# Patient Record
Sex: Female | Born: 1955 | Race: White | Hispanic: No | Marital: Married | Smoking: Never smoker
Health system: Southern US, Community
[De-identification: ages and names within clinical notes are randomized; demographics above are authoritative.]

## PROBLEM LIST (undated history)

## (undated) DIAGNOSIS — H9190 Unspecified hearing loss, unspecified ear: Secondary | ICD-10-CM

## (undated) DIAGNOSIS — H269 Unspecified cataract: Secondary | ICD-10-CM

## (undated) HISTORY — DX: Unspecified cataract: H26.9

## (undated) HISTORY — PX: STAPEDECTOMY: SHX2435

## (undated) HISTORY — DX: Unspecified hearing loss, unspecified ear: H91.90

---

## 1997-12-20 ENCOUNTER — Ambulatory Visit (HOSPITAL_COMMUNITY): Admission: RE | Admit: 1997-12-20 | Discharge: 1997-12-20 | Payer: Self-pay | Admitting: Obstetrics and Gynecology

## 1998-12-24 ENCOUNTER — Other Ambulatory Visit: Admission: RE | Admit: 1998-12-24 | Discharge: 1998-12-24 | Payer: Self-pay | Admitting: Obstetrics and Gynecology

## 1999-03-13 ENCOUNTER — Other Ambulatory Visit: Admission: RE | Admit: 1999-03-13 | Discharge: 1999-03-13 | Payer: Self-pay | Admitting: Obstetrics and Gynecology

## 1999-12-03 ENCOUNTER — Encounter: Payer: Self-pay | Admitting: Obstetrics and Gynecology

## 1999-12-03 ENCOUNTER — Ambulatory Visit (HOSPITAL_COMMUNITY): Admission: RE | Admit: 1999-12-03 | Discharge: 1999-12-03 | Payer: Self-pay | Admitting: Obstetrics and Gynecology

## 2000-01-01 ENCOUNTER — Other Ambulatory Visit: Admission: RE | Admit: 2000-01-01 | Discharge: 2000-01-01 | Payer: Self-pay | Admitting: Obstetrics and Gynecology

## 2000-12-10 ENCOUNTER — Ambulatory Visit (HOSPITAL_COMMUNITY): Admission: RE | Admit: 2000-12-10 | Discharge: 2000-12-10 | Payer: Self-pay | Admitting: Obstetrics and Gynecology

## 2000-12-10 ENCOUNTER — Encounter: Payer: Self-pay | Admitting: Obstetrics and Gynecology

## 2001-04-01 ENCOUNTER — Other Ambulatory Visit: Admission: RE | Admit: 2001-04-01 | Discharge: 2001-04-01 | Payer: Self-pay | Admitting: Obstetrics and Gynecology

## 2001-12-29 ENCOUNTER — Ambulatory Visit (HOSPITAL_COMMUNITY): Admission: RE | Admit: 2001-12-29 | Discharge: 2001-12-29 | Payer: Self-pay | Admitting: Obstetrics and Gynecology

## 2001-12-29 ENCOUNTER — Encounter: Payer: Self-pay | Admitting: Obstetrics and Gynecology

## 2002-04-20 ENCOUNTER — Encounter: Payer: Self-pay | Admitting: Obstetrics and Gynecology

## 2002-04-20 ENCOUNTER — Encounter: Admission: RE | Admit: 2002-04-20 | Discharge: 2002-04-20 | Payer: Self-pay | Admitting: Obstetrics and Gynecology

## 2002-07-12 ENCOUNTER — Encounter: Payer: Self-pay | Admitting: Obstetrics and Gynecology

## 2002-07-12 ENCOUNTER — Encounter: Admission: RE | Admit: 2002-07-12 | Discharge: 2002-07-12 | Payer: Self-pay | Admitting: Obstetrics and Gynecology

## 2002-10-24 ENCOUNTER — Encounter: Admission: RE | Admit: 2002-10-24 | Discharge: 2002-10-24 | Payer: Self-pay | Admitting: Obstetrics and Gynecology

## 2002-10-24 ENCOUNTER — Encounter: Payer: Self-pay | Admitting: Obstetrics and Gynecology

## 2003-01-01 ENCOUNTER — Encounter: Payer: Self-pay | Admitting: Obstetrics and Gynecology

## 2003-01-01 ENCOUNTER — Ambulatory Visit (HOSPITAL_COMMUNITY): Admission: RE | Admit: 2003-01-01 | Discharge: 2003-01-01 | Payer: Self-pay | Admitting: Obstetrics and Gynecology

## 2004-01-04 ENCOUNTER — Ambulatory Visit (HOSPITAL_COMMUNITY): Admission: RE | Admit: 2004-01-04 | Discharge: 2004-01-04 | Payer: Self-pay | Admitting: Obstetrics and Gynecology

## 2004-04-17 ENCOUNTER — Other Ambulatory Visit: Admission: RE | Admit: 2004-04-17 | Discharge: 2004-04-17 | Payer: Self-pay | Admitting: Obstetrics and Gynecology

## 2005-01-07 ENCOUNTER — Ambulatory Visit (HOSPITAL_COMMUNITY): Admission: RE | Admit: 2005-01-07 | Discharge: 2005-01-07 | Payer: Self-pay | Admitting: Obstetrics and Gynecology

## 2005-05-13 ENCOUNTER — Other Ambulatory Visit: Admission: RE | Admit: 2005-05-13 | Discharge: 2005-05-13 | Payer: Self-pay | Admitting: Obstetrics and Gynecology

## 2006-01-11 ENCOUNTER — Ambulatory Visit (HOSPITAL_COMMUNITY): Admission: RE | Admit: 2006-01-11 | Discharge: 2006-01-11 | Payer: Self-pay | Admitting: Obstetrics and Gynecology

## 2006-05-18 ENCOUNTER — Other Ambulatory Visit: Admission: RE | Admit: 2006-05-18 | Discharge: 2006-05-18 | Payer: Self-pay | Admitting: Obstetrics and Gynecology

## 2007-01-19 ENCOUNTER — Ambulatory Visit (HOSPITAL_COMMUNITY): Admission: RE | Admit: 2007-01-19 | Discharge: 2007-01-19 | Payer: Self-pay | Admitting: Obstetrics and Gynecology

## 2007-05-25 ENCOUNTER — Other Ambulatory Visit: Admission: RE | Admit: 2007-05-25 | Discharge: 2007-05-25 | Payer: Self-pay | Admitting: Obstetrics and Gynecology

## 2008-01-24 ENCOUNTER — Ambulatory Visit (HOSPITAL_COMMUNITY): Admission: RE | Admit: 2008-01-24 | Discharge: 2008-01-24 | Payer: Self-pay | Admitting: Obstetrics and Gynecology

## 2008-06-12 ENCOUNTER — Other Ambulatory Visit: Admission: RE | Admit: 2008-06-12 | Discharge: 2008-06-12 | Payer: Self-pay | Admitting: Obstetrics and Gynecology

## 2009-01-24 ENCOUNTER — Ambulatory Visit (HOSPITAL_COMMUNITY): Admission: RE | Admit: 2009-01-24 | Discharge: 2009-01-24 | Payer: Self-pay | Admitting: Obstetrics and Gynecology

## 2009-01-30 ENCOUNTER — Encounter: Admission: RE | Admit: 2009-01-30 | Discharge: 2009-01-30 | Payer: Self-pay | Admitting: Obstetrics and Gynecology

## 2009-07-18 ENCOUNTER — Other Ambulatory Visit: Admission: RE | Admit: 2009-07-18 | Discharge: 2009-07-18 | Payer: Self-pay | Admitting: Obstetrics and Gynecology

## 2009-07-22 ENCOUNTER — Encounter: Admission: RE | Admit: 2009-07-22 | Discharge: 2009-07-22 | Payer: Self-pay | Admitting: Obstetrics and Gynecology

## 2010-01-30 ENCOUNTER — Encounter: Admission: RE | Admit: 2010-01-30 | Discharge: 2010-01-30 | Payer: Self-pay | Admitting: Obstetrics and Gynecology

## 2010-07-21 ENCOUNTER — Encounter: Admission: RE | Admit: 2010-07-21 | Discharge: 2010-07-21 | Payer: Self-pay | Admitting: Obstetrics and Gynecology

## 2010-10-08 ENCOUNTER — Other Ambulatory Visit
Admission: RE | Admit: 2010-10-08 | Discharge: 2010-10-08 | Payer: Self-pay | Source: Home / Self Care | Admitting: Obstetrics and Gynecology

## 2011-01-15 ENCOUNTER — Other Ambulatory Visit: Payer: Self-pay | Admitting: Obstetrics and Gynecology

## 2011-01-15 DIAGNOSIS — Z1231 Encounter for screening mammogram for malignant neoplasm of breast: Secondary | ICD-10-CM

## 2011-02-06 ENCOUNTER — Ambulatory Visit
Admission: RE | Admit: 2011-02-06 | Discharge: 2011-02-06 | Disposition: A | Discharge status: Home / Self Care | Payer: BC Managed Care – PPO | Source: Ambulatory Visit | Attending: Obstetrics and Gynecology | Admitting: Obstetrics and Gynecology

## 2011-02-06 DIAGNOSIS — Z1231 Encounter for screening mammogram for malignant neoplasm of breast: Secondary | ICD-10-CM

## 2011-10-21 ENCOUNTER — Other Ambulatory Visit: Payer: Self-pay | Admitting: Obstetrics and Gynecology

## 2011-10-21 ENCOUNTER — Other Ambulatory Visit (HOSPITAL_COMMUNITY)
Admission: RE | Admit: 2011-10-21 | Discharge: 2011-10-21 | Disposition: A | Discharge status: Home / Self Care | Payer: BC Managed Care – PPO | Source: Ambulatory Visit | Attending: Obstetrics and Gynecology | Admitting: Obstetrics and Gynecology

## 2011-10-21 DIAGNOSIS — Z01419 Encounter for gynecological examination (general) (routine) without abnormal findings: Secondary | ICD-10-CM | POA: Insufficient documentation

## 2012-01-05 ENCOUNTER — Other Ambulatory Visit (HOSPITAL_COMMUNITY): Payer: Self-pay | Admitting: Obstetrics and Gynecology

## 2012-01-05 DIAGNOSIS — Z1231 Encounter for screening mammogram for malignant neoplasm of breast: Secondary | ICD-10-CM

## 2012-02-10 ENCOUNTER — Ambulatory Visit (HOSPITAL_COMMUNITY)
Admission: RE | Admit: 2012-02-10 | Discharge: 2012-02-10 | Disposition: A | Discharge status: Home / Self Care | Payer: BC Managed Care – PPO | Source: Ambulatory Visit | Attending: Obstetrics and Gynecology | Admitting: Obstetrics and Gynecology

## 2012-02-10 DIAGNOSIS — Z1231 Encounter for screening mammogram for malignant neoplasm of breast: Secondary | ICD-10-CM | POA: Insufficient documentation

## 2013-01-26 ENCOUNTER — Other Ambulatory Visit (HOSPITAL_COMMUNITY): Payer: Self-pay | Admitting: Obstetrics and Gynecology

## 2013-01-26 DIAGNOSIS — Z1231 Encounter for screening mammogram for malignant neoplasm of breast: Secondary | ICD-10-CM

## 2013-02-13 ENCOUNTER — Ambulatory Visit (HOSPITAL_COMMUNITY)
Admission: RE | Admit: 2013-02-13 | Discharge: 2013-02-13 | Disposition: A | Discharge status: Home / Self Care | Payer: BC Managed Care – PPO | Source: Ambulatory Visit | Attending: Obstetrics and Gynecology | Admitting: Obstetrics and Gynecology

## 2013-02-13 DIAGNOSIS — Z1231 Encounter for screening mammogram for malignant neoplasm of breast: Secondary | ICD-10-CM | POA: Insufficient documentation

## 2013-10-19 HISTORY — PX: BREAST BIOPSY: SHX20

## 2014-01-29 ENCOUNTER — Other Ambulatory Visit (HOSPITAL_COMMUNITY): Payer: Self-pay | Admitting: Obstetrics and Gynecology

## 2014-01-29 DIAGNOSIS — Z1231 Encounter for screening mammogram for malignant neoplasm of breast: Secondary | ICD-10-CM

## 2014-02-16 ENCOUNTER — Ambulatory Visit (HOSPITAL_COMMUNITY)
Admission: RE | Admit: 2014-02-16 | Discharge: 2014-02-16 | Disposition: A | Discharge status: Home / Self Care | Payer: BC Managed Care – PPO | Source: Ambulatory Visit | Attending: Obstetrics and Gynecology | Admitting: Obstetrics and Gynecology

## 2014-02-16 DIAGNOSIS — Z1231 Encounter for screening mammogram for malignant neoplasm of breast: Secondary | ICD-10-CM | POA: Insufficient documentation

## 2014-02-19 ENCOUNTER — Other Ambulatory Visit: Payer: Self-pay | Admitting: Obstetrics and Gynecology

## 2014-02-19 DIAGNOSIS — R928 Other abnormal and inconclusive findings on diagnostic imaging of breast: Secondary | ICD-10-CM

## 2014-03-02 ENCOUNTER — Ambulatory Visit
Admission: RE | Admit: 2014-03-02 | Discharge: 2014-03-02 | Disposition: A | Discharge status: Home / Self Care | Payer: BC Managed Care – PPO | Source: Ambulatory Visit | Attending: Obstetrics and Gynecology | Admitting: Obstetrics and Gynecology

## 2014-03-02 ENCOUNTER — Other Ambulatory Visit: Payer: Self-pay | Admitting: Obstetrics and Gynecology

## 2014-03-02 DIAGNOSIS — R928 Other abnormal and inconclusive findings on diagnostic imaging of breast: Secondary | ICD-10-CM

## 2014-03-07 ENCOUNTER — Other Ambulatory Visit: Payer: Self-pay | Admitting: Obstetrics and Gynecology

## 2014-03-07 ENCOUNTER — Ambulatory Visit
Admission: RE | Admit: 2014-03-07 | Discharge: 2014-03-07 | Disposition: A | Discharge status: Home / Self Care | Payer: BC Managed Care – PPO | Source: Ambulatory Visit | Attending: Obstetrics and Gynecology | Admitting: Obstetrics and Gynecology

## 2014-03-07 DIAGNOSIS — R928 Other abnormal and inconclusive findings on diagnostic imaging of breast: Secondary | ICD-10-CM

## 2015-01-01 ENCOUNTER — Other Ambulatory Visit: Payer: Self-pay | Admitting: Nurse Practitioner

## 2015-01-01 ENCOUNTER — Other Ambulatory Visit (HOSPITAL_COMMUNITY)
Admission: RE | Admit: 2015-01-01 | Discharge: 2015-01-01 | Disposition: A | Discharge status: Home / Self Care | Payer: 59 | Source: Ambulatory Visit | Attending: Nurse Practitioner | Admitting: Nurse Practitioner

## 2015-01-01 DIAGNOSIS — Z1151 Encounter for screening for human papillomavirus (HPV): Secondary | ICD-10-CM | POA: Insufficient documentation

## 2015-01-01 DIAGNOSIS — Z01419 Encounter for gynecological examination (general) (routine) without abnormal findings: Secondary | ICD-10-CM | POA: Diagnosis present

## 2015-01-07 LAB — CYTOLOGY - PAP

## 2015-02-07 ENCOUNTER — Other Ambulatory Visit: Payer: Self-pay

## 2015-02-07 DIAGNOSIS — Z1231 Encounter for screening mammogram for malignant neoplasm of breast: Secondary | ICD-10-CM

## 2015-02-26 ENCOUNTER — Ambulatory Visit
Admission: RE | Admit: 2015-02-26 | Discharge: 2015-02-26 | Disposition: A | Discharge status: Home / Self Care | Payer: 59 | Source: Ambulatory Visit

## 2015-02-26 DIAGNOSIS — Z1231 Encounter for screening mammogram for malignant neoplasm of breast: Secondary | ICD-10-CM

## 2016-03-05 ENCOUNTER — Other Ambulatory Visit: Payer: Self-pay

## 2016-03-05 DIAGNOSIS — Z1231 Encounter for screening mammogram for malignant neoplasm of breast: Secondary | ICD-10-CM

## 2016-03-23 ENCOUNTER — Ambulatory Visit
Admission: RE | Admit: 2016-03-23 | Discharge: 2016-03-23 | Disposition: A | Discharge status: Home / Self Care | Payer: 59 | Source: Ambulatory Visit

## 2016-03-23 DIAGNOSIS — Z1231 Encounter for screening mammogram for malignant neoplasm of breast: Secondary | ICD-10-CM

## 2016-08-20 ENCOUNTER — Other Ambulatory Visit: Payer: Self-pay

## 2016-08-20 DIAGNOSIS — I83893 Varicose veins of bilateral lower extremities with other complications: Secondary | ICD-10-CM

## 2016-09-29 ENCOUNTER — Encounter: Payer: Self-pay | Admitting: Vascular Surgery

## 2016-10-06 ENCOUNTER — Encounter: Payer: Self-pay | Admitting: Vascular Surgery

## 2016-10-06 ENCOUNTER — Ambulatory Visit (HOSPITAL_COMMUNITY)
Admission: RE | Admit: 2016-10-06 | Discharge: 2016-10-06 | Disposition: A | Discharge status: Home / Self Care | Payer: 59 | Source: Ambulatory Visit | Attending: Vascular Surgery | Admitting: Vascular Surgery

## 2016-10-06 ENCOUNTER — Ambulatory Visit (INDEPENDENT_AMBULATORY_CARE_PROVIDER_SITE_OTHER): Payer: 59 | Admitting: Vascular Surgery

## 2016-10-06 VITALS — BP 130/84 | HR 80 | Temp 98.7°F | Resp 18 | Ht 62.5 in | Wt 129.0 lb

## 2016-10-06 DIAGNOSIS — I83893 Varicose veins of bilateral lower extremities with other complications: Secondary | ICD-10-CM | POA: Insufficient documentation

## 2016-10-06 NOTE — Progress Notes (Signed)
[facility] [place]  Patient name: Allison Kline MRN: [id number] DOB: 08/20/1956 Sex: female  REASON FOR CONSULT: Lower extremity discomfort with the varicose veins.  HPI: Allison CharityRegina Kline is a 60 y.o. female, who is seen today for discussion of lower extremity discomfort. 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 There are several different components of this. She reports an aching sensation over her anterior thighs. She reports this actually occurs after she has been standing for a long period of time rather than when she is actually standing. She notices this when she gets home and sits or lies down. This may be somewhat worse on the left leg and her right leg. She has no history of DVT. She does have a strong family history of varicosities in her father and his mother. Also in her sister. 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 She can occasionally have the pain specifically over these but the main issue is aching in her thighs. She has no history of arterial insufficiency. Health is otherwise excellent  History reviewed. No pertinent past medical history.  History reviewed. No pertinent family history.  SOCIAL HISTORY: Social History   Social History  . Marital status: Married    Spouse name: N/A  . Number of children: N/A  . Years of education: N/A   Occupational History  . Not on file.   Social History Main Topics  . Smoking status: Never Smoker  . Smokeless tobacco: Never Used  . Alcohol use No  . Drug use: No  . Sexual activity: Not on file   Other Topics Concern  . Not on file   Social History Narrative  . No narrative on file    No Known Allergies  Current Outpatient Prescriptions  Medication Sig Dispense Refill  . OSPHENA 60 MG TABS      No  current facility-administered medications for this visit.     REVIEW OF SYSTEMS:  [X]  denotes positive finding, [ ]  denotes negative finding Cardiac  Comments:  Chest pain or chest pressure:    Shortness of breath upon exertion:    Short of breath when lying flat:    Irregular heart rhythm:        Vascular    Pain in calf, thigh, or hip brought on by ambulation: x   Pain in feet at night that wakes you up from your sleep:     Blood clot in your veins:    Leg swelling:         Pulmonary    Oxygen at home:    Productive cough:     Wheezing:         Neurologic    Sudden weakness in arms or legs:     Sudden numbness in arms or legs:     Sudden onset of difficulty speaking or slurred speech:    Temporary loss of vision in one eye:     Problems with dizziness:         Gastrointestinal    Blood in stool:     Vomited blood:         Genitourinary    Burning when urinating:     Blood in urine:        Psychiatric    Major depression:         Hematologic  Bleeding problems:    Problems with blood clotting too easily:        Skin    Rashes or ulcers:        Constitutional    Fever or chills:      PHYSICAL EXAM: Vitals:   10/06/16 1550  BP: 130/84  Pulse: 80  Resp: 18  Temp: 98.7 F (37.1 C)  TempSrc: Oral  SpO2: 98%  Weight: 129 lb (58.5 kg)  Height: 5' 2.5" (1.588 m)    GENERAL: The patient is a well-nourished female, in no acute distress. The vital signs are documented above. CARDIOVASCULAR: 2+ radial and 2+ dorsalis pedis pulses bilaterally PULMONARY: There is good air exchange  ABDOMEN: Soft and non-tender  MUSCULOSKELETAL: There are no major deformities or cyanosis. NEUROLOGIC: No focal weakness or paresthesias are detected. SKIN: There are no ulcers or rashes noted. PSYCHIATRIC: The patient has a normal affect. She does have extensive telangiectasia over her anterior and lateral thighs. Also tributary varicosities and reticular veins on her distal  thighs extending into her calves. No swelling and no changes of chronic venous hypertension  DATA:  Lower extremity noninvasive studies were reviewed with the patient. This does show some mild deep venous reflux on the left. She has mild reflux in her great saphenous vein bilaterally with no dilatation of the saphenous vein  MEDICAL ISSUES: Had long discussion with this regarding her symptoms. I do not think this is related to venous pathology. Explained that with her mild reflux in her nondilated saphenous vein I would not suspect any symptoms and if so this would be her calves. Explained that she is at no increased risk for DVT or other more serious venous problems. Did explain the option of sclerotherapy for appearance concerns if these are concerning to her for both the telangiectasia and for the varicosities. Explained the treatment option and approximate out of pocket expense for these. She wishes to continue this further we'll notify should she wish to pursue this. Otherwise she was reassured with the discussion regarding the safety of this.   Larina Earthlyodd F. Ariannah Arenson, MD FACS [facility] [place] [facility] Tel [phone] Pager [phone]

## 2017-03-18 ENCOUNTER — Other Ambulatory Visit: Payer: Self-pay | Admitting: Obstetrics and Gynecology

## 2017-03-18 DIAGNOSIS — Z1231 Encounter for screening mammogram for malignant neoplasm of breast: Secondary | ICD-10-CM

## 2017-03-31 ENCOUNTER — Ambulatory Visit
Admission: RE | Admit: 2017-03-31 | Discharge: 2017-03-31 | Disposition: A | Discharge status: Home / Self Care | Payer: 59 | Source: Ambulatory Visit | Attending: Obstetrics and Gynecology | Admitting: Obstetrics and Gynecology

## 2017-03-31 DIAGNOSIS — Z1231 Encounter for screening mammogram for malignant neoplasm of breast: Secondary | ICD-10-CM

## 2017-09-17 ENCOUNTER — Encounter: Payer: Self-pay | Admitting: Family Medicine

## 2017-09-17 ENCOUNTER — Ambulatory Visit (INDEPENDENT_AMBULATORY_CARE_PROVIDER_SITE_OTHER): Payer: 59 | Admitting: Family Medicine

## 2017-09-17 VITALS — HR 68 | Ht 62.5 in | Wt 131.0 lb

## 2017-09-17 DIAGNOSIS — Z13220 Encounter for screening for lipoid disorders: Secondary | ICD-10-CM | POA: Diagnosis not present

## 2017-09-17 DIAGNOSIS — Z7689 Persons encountering health services in other specified circumstances: Secondary | ICD-10-CM | POA: Diagnosis not present

## 2017-09-17 LAB — LIPID PANEL
CHOL/HDL RATIO: 2
CHOLESTEROL: 215 mg/dL — AB (ref 0–200)
HDL: 90.5 mg/dL (ref >39.00)
LDL Cholesterol: 113 mg/dL — ABNORMAL HIGH (ref 0–99)
NonHDL: 124.11
TRIGLYCERIDES: 56 mg/dL (ref 0.0–149.0)
VLDL: 11.2 mg/dL (ref 0.0–40.0)

## 2017-09-17 NOTE — Progress Notes (Signed)
   Subjective:    Patient ID: Allison Kline, female    DOB: 11/11/1955, 61 y.o.   MRN: [id number]  HPI This is a 61 yo female who presents today to establish care. She is married with one daughter. She is a Teacher, early years/prepharmacist. She enjoys [employer] classes and walking.   Had bone density study earlier this year which showed osteopenia and had normal vitamin D. Has worked to increase weight bearing exercise. Takes calcium.   Last CPE- gyn this year Mammo- 03/31/17 Pap- 01/01/15- never abnormal Colonoscopy- due 2019, she will call about scheduling Tdap-think she had within the last 2 years Flu- annual Eye- annual Dental- regular Exercise- regular    No past medical history on file. Past Surgical History:  Procedure Laterality Date  . BREAST BIOPSY Right 2015   Family History  Problem Relation Age of Onset  . Breast cancer Maternal Grandmother    Social History   Tobacco Use  . Smoking status: Never Smoker  . Smokeless tobacco: Never Used  Substance Use Topics  . Alcohol use: No  . Drug use: No      Review of Systems  Constitutional: Negative for fatigue and unexpected weight change.  Respiratory: Negative for shortness of breath.   Cardiovascular: Positive for leg swelling (mild after working all day). Negative for chest pain.  Genitourinary: Positive for frequency (drinks a lot of water). Negative for difficulty urinating and dysuria.  Psychiatric/Behavioral: Negative for dysphoric mood. The patient is not nervous/anxious.        Objective:   Physical Exam Physical Exam  Constitutional: Oriented to person, place, and time. She appears well-developed and well-nourished.  HENT:  Head: Normocephalic and atraumatic.  Eyes: Conjunctivae are normal.  Neck: Normal range of motion. Neck supple.  Cardiovascular: Normal rate, regular rhythm and normal heart sounds.   Pulmonary/Chest: Effort normal and breath sounds normal.  Musculoskeletal: Normal range of motion.    Neurological: Alert and oriented to person, place, and time.  Skin: Skin is warm and dry.  Psychiatric: Normal mood and affect. Behavior is normal. Judgment and thought content normal.  Vitals reviewed. Pulse 68   Ht 5' 2.5" (1.588 m)   Wt 131 lb (59.4 kg)   SpO2 98%   BMI 23.58 kg/m  Wt Readings from Last 3 Encounters:  09/17/17 131 lb (59.4 kg)  10/06/16 129 lb (58.5 kg)   BP Readings from Last 3 Encounters:  10/06/16 130/84       Assessment & Plan:  1. Encounter to establish care  - Discussed and encouraged healthy lifestyle choices- adequate sleep, regular exercise, stress management and healthy food choices.  -We will request records  2. Screening for lipid disorders - Lipid panel  -Follow-up in 1 year Olean Reeeborah Gessner, FNP-BC  [facility]  09/17/2017 10:17 AM

## 2017-09-17 NOTE — Patient Instructions (Signed)
Please sign up for your mychart account  I will notify you of any findings from your records

## 2017-09-19 ENCOUNTER — Encounter: Payer: Self-pay | Admitting: Family Medicine

## 2017-12-15 ENCOUNTER — Encounter: Payer: Self-pay | Admitting: Family Medicine

## 2018-02-20 ENCOUNTER — Ambulatory Visit (HOSPITAL_COMMUNITY)
Admission: EM | Admit: 2018-02-20 | Discharge: 2018-02-20 | Disposition: A | Discharge status: Home / Self Care | Payer: 59 | Attending: Family Medicine | Admitting: Family Medicine

## 2018-02-20 ENCOUNTER — Encounter (HOSPITAL_COMMUNITY): Payer: Self-pay | Admitting: Emergency Medicine

## 2018-02-20 ENCOUNTER — Ambulatory Visit (INDEPENDENT_AMBULATORY_CARE_PROVIDER_SITE_OTHER): Payer: 59

## 2018-02-20 DIAGNOSIS — S92514A Nondisplaced fracture of proximal phalanx of right lesser toe(s), initial encounter for closed fracture: Secondary | ICD-10-CM | POA: Diagnosis not present

## 2018-02-20 NOTE — ED Triage Notes (Signed)
Pt stubbed pinkie toe of right foot last sat, has been buddy taping it but still very swollen.

## 2018-02-20 NOTE — ED Provider Notes (Signed)
[facility]    CSN: [id number] Arrival date & time: 02/20/18  1223     History   Chief Complaint Chief Complaint  Patient presents with  . Toe Pain    HPI Allison Kline is a 62 y.o. female no contributing past medical history presenting today for evaluation of left pinky toe pain and swelling.  She states that 1 week ago she was in her garage and hit her foot on a folding chair.  Since she has had swelling, taking ibuprofen, taping toe as well as apply ice.  Swelling has persisted without improvement.  Denies numbness or tingling.  Pain is mainly when she puts shoes on.  HPI  Past Medical History:  Diagnosis Date  . Cataract     There are no active problems to display for this patient.   Past Surgical History:  Procedure Laterality Date  . BREAST BIOPSY Right 2015    OB History   None      Home Medications    Prior to Admission medications   Medication Sig Start Date End Date Taking? Authorizing Provider  DUREZOL 0.05 % EMUL instill 1 drop IN EACH EYE every 2 hours FOR 24 hours, THEN instill 1 drop IN EACH EYE EVERY 4 HOURS 08/18/17   [provider]  FLORICAL 8.3-364 MG CAPS capsule TAKE 1 CAPSULE BY MOUTH 2 TIMES DAILY WITH AS VITAMIN d daily 06/29/17   [provider]  OSPHENA 60 MG TABS  09/25/16   [provider]    Family History Family History  Problem Relation Age of Onset  . Breast cancer Maternal Grandmother   . Heart disease Mother   . Hyperlipidemia Father   . Hypertension Father   . Heart disease Father   . Diabetes Brother     Social History Social History   Tobacco Use  . Smoking status: Never Smoker  . Smokeless tobacco: Never Used  Substance Use Topics  . Alcohol use: No  . Drug use: No     Allergies   Patient has no known allergies.   Review of Systems Review of Systems  Constitutional: Negative for fatigue and fever.  Respiratory: Negative for shortness of breath.   Cardiovascular:  Negative for chest pain.  Gastrointestinal: Negative for nausea and vomiting.  Musculoskeletal: Positive for gait problem, joint swelling and myalgias. Negative for arthralgias, back pain, neck pain and neck stiffness.  Skin: Negative for color change and wound.  Neurological: Negative for dizziness, weakness, light-headedness and numbness.     Physical Exam Triage Vital Signs ED Triage Vitals [02/20/18 1343]  Enc Vitals Group     BP 140/64     Pulse Rate 74     Resp 16     Temp 98.3 F (36.8 C)     Temp Source Oral     SpO2 100 %     Weight      Height      Head Circumference      Peak Flow      Pain Score      Pain Loc      Pain Edu?      Excl. in GC?    No data found.  Updated Vital Signs BP 140/64 (BP Location: Right Arm)   Pulse 74   Temp 98.3 F (36.8 C) (Oral)   Resp 16   SpO2 100%   Visual Acuity Right Eye Distance:   Left Eye Distance:   Bilateral Distance:    Right  Eye Near:   Left Eye Near:    Bilateral Near:     Physical Exam  Constitutional: She appears well-developed and well-nourished. No distress.  HENT:  Head: Normocephalic and atraumatic.  Eyes: Conjunctivae are normal.  Neck: Neck supple.  Cardiovascular: Normal rate.  Pulmonary/Chest: Effort normal. No respiratory distress.  Musculoskeletal: She exhibits no edema.  Swelling to fifth toe, tenderness to palpation at base, dorsalis pedis 2+  Neurological: She is alert.  Skin: Skin is warm and dry.  Psychiatric: She has a normal mood and affect.  Nursing note and vitals reviewed.    UC Treatments / Results  Labs (all labs ordered are listed, but only abnormal results are displayed) Labs Reviewed - No data to display  EKG None  Radiology Dg Foot Complete Right  Result Date: 02/20/2018 CLINICAL DATA:  Injury to the right fifth digit 1 week ago. Pain. Swelling. EXAM: RIGHT FOOT COMPLETE - 3+ VIEW COMPARISON:  None. FINDINGS: A transverse fracture in the base the proximal phalanx  in the fifth digit is displaced slightly medial. The DIP joint in the fifth digit is fused. No additional fractures are present. Extensive soft tissue swelling is present about the fifth digit. A metallic ring is noted on the second digit. A plantar calcaneal spur is present. Foot is otherwise unremarkable. IMPRESSION: 1. Transverse fracture involving the proximal phalanx in the fifth digit is slightly displaced. 2. Extensive soft tissue swelling about the fifth digit and fracture. 3. No additional fractures. Electronically Signed   By: Marin Roberts M.D.   On: 02/20/2018 14:27    Procedures Procedures (including critical care time)  Medications Ordered in UC Medications - No data to display  Initial Impression / Assessment and Plan / UC Course  I have reviewed the triage vital signs and the nursing notes.  Pertinent labs & imaging results that were available during my care of the patient were reviewed by me and considered in my medical decision making (see chart for details).     Fracture of proximal phalanx of fifth toe.  Slight displacement.  Will buddy tape, provide postop shoe, wear for 3 to 4 weeks.  Follow-up with orthopedics if not improving.  Continue NSAIDs, ice, elevation. Discussed strict return precautions. Patient verbalized understanding and is agreeable with plan.  Final Clinical Impressions(s) / UC Diagnoses   Final diagnoses:  Closed nondisplaced fracture of proximal phalanx of lesser toe of right foot, initial encounter     Discharge Instructions     Please continue to buddy tape, wear post-op shoe 4 weeks  Use anti-inflammatories for pain/swelling. You may take up to 800 mg Ibuprofen every 8 hours with food. You may supplement Ibuprofen with Tylenol [phone] mg every 8 hours.   Ice  Follow up with orthopedics if pain not improving   ED Prescriptions    None     Controlled Substance Prescriptions [place] Controlled Substance Registry consulted? Not  Applicable   Lew Dawes, [place] 02/20/18 1741

## 2018-02-20 NOTE — Discharge Instructions (Signed)
Please continue to buddy tape, wear post-op shoe 4 weeks  Use anti-inflammatories for pain/swelling. You may take up to 800 mg Ibuprofen every 8 hours with food. You may supplement Ibuprofen with Tylenol [phone] mg every 8 hours.   Ice  Follow up with orthopedics if pain not improving

## 2018-03-21 ENCOUNTER — Other Ambulatory Visit: Payer: Self-pay | Admitting: Obstetrics and Gynecology

## 2018-03-21 ENCOUNTER — Other Ambulatory Visit: Payer: Self-pay | Admitting: Nurse Practitioner

## 2018-03-21 DIAGNOSIS — Z1231 Encounter for screening mammogram for malignant neoplasm of breast: Secondary | ICD-10-CM

## 2018-04-08 ENCOUNTER — Ambulatory Visit
Admission: RE | Admit: 2018-04-08 | Discharge: 2018-04-08 | Disposition: A | Discharge status: Home / Self Care | Payer: 59 | Source: Ambulatory Visit | Attending: Nurse Practitioner | Admitting: Nurse Practitioner

## 2018-04-08 DIAGNOSIS — Z1231 Encounter for screening mammogram for malignant neoplasm of breast: Secondary | ICD-10-CM

## 2018-05-05 ENCOUNTER — Other Ambulatory Visit: Payer: Self-pay | Admitting: Nurse Practitioner

## 2018-05-05 ENCOUNTER — Other Ambulatory Visit (HOSPITAL_COMMUNITY)
Admission: RE | Admit: 2018-05-05 | Discharge: 2018-05-05 | Disposition: A | Discharge status: Home / Self Care | Payer: 59 | Source: Ambulatory Visit | Attending: Nurse Practitioner | Admitting: Nurse Practitioner

## 2018-05-05 DIAGNOSIS — Z01419 Encounter for gynecological examination (general) (routine) without abnormal findings: Secondary | ICD-10-CM | POA: Insufficient documentation

## 2018-05-09 LAB — CYTOLOGY - PAP
DIAGNOSIS: NEGATIVE
HPV (WINDOPATH): NOT DETECTED

## 2019-06-02 ENCOUNTER — Other Ambulatory Visit: Payer: Self-pay | Admitting: Nurse Practitioner

## 2019-06-02 DIAGNOSIS — Z1231 Encounter for screening mammogram for malignant neoplasm of breast: Secondary | ICD-10-CM

## 2019-07-06 ENCOUNTER — Other Ambulatory Visit: Payer: Self-pay

## 2019-07-06 DIAGNOSIS — Z20822 Contact with and (suspected) exposure to covid-19: Secondary | ICD-10-CM

## 2019-07-08 LAB — NOVEL CORONAVIRUS, NAA: SARS-CoV-2, NAA: NOT DETECTED

## 2019-07-17 ENCOUNTER — Ambulatory Visit: Payer: 59

## 2019-07-21 ENCOUNTER — Ambulatory Visit
Admission: RE | Admit: 2019-07-21 | Discharge: 2019-07-21 | Disposition: A | Discharge status: Home / Self Care | Payer: 59 | Source: Ambulatory Visit | Attending: Nurse Practitioner | Admitting: Nurse Practitioner

## 2019-07-21 ENCOUNTER — Other Ambulatory Visit: Payer: Self-pay

## 2019-07-21 DIAGNOSIS — Z1231 Encounter for screening mammogram for malignant neoplasm of breast: Secondary | ICD-10-CM

## 2019-11-20 ENCOUNTER — Ambulatory Visit: Payer: 59 | Attending: Internal Medicine

## 2019-11-20 DIAGNOSIS — Z20822 Contact with and (suspected) exposure to covid-19: Secondary | ICD-10-CM

## 2019-11-21 LAB — NOVEL CORONAVIRUS, NAA: SARS-CoV-2, NAA: NOT DETECTED

## 2020-06-06 ENCOUNTER — Other Ambulatory Visit: Payer: Self-pay | Admitting: Nurse Practitioner

## 2020-06-06 DIAGNOSIS — Z1231 Encounter for screening mammogram for malignant neoplasm of breast: Secondary | ICD-10-CM

## 2020-06-10 ENCOUNTER — Other Ambulatory Visit: Payer: Self-pay | Admitting: Nurse Practitioner

## 2020-06-10 DIAGNOSIS — M8580 Other specified disorders of bone density and structure, unspecified site: Secondary | ICD-10-CM

## 2020-07-22 ENCOUNTER — Other Ambulatory Visit: Payer: Self-pay

## 2020-07-22 ENCOUNTER — Ambulatory Visit
Admission: RE | Admit: 2020-07-22 | Discharge: 2020-07-22 | Disposition: A | Discharge status: Home / Self Care | Payer: 59 | Source: Ambulatory Visit | Attending: Nurse Practitioner | Admitting: Nurse Practitioner

## 2020-07-22 DIAGNOSIS — Z1231 Encounter for screening mammogram for malignant neoplasm of breast: Secondary | ICD-10-CM

## 2020-08-21 ENCOUNTER — Other Ambulatory Visit: Payer: Self-pay

## 2020-08-21 ENCOUNTER — Ambulatory Visit
Admission: RE | Admit: 2020-08-21 | Discharge: 2020-08-21 | Disposition: A | Discharge status: Home / Self Care | Payer: 59 | Source: Ambulatory Visit | Attending: Nurse Practitioner | Admitting: Nurse Practitioner

## 2020-08-21 DIAGNOSIS — M8580 Other specified disorders of bone density and structure, unspecified site: Secondary | ICD-10-CM

## 2020-12-31 NOTE — Progress Notes (Signed)
[facility] [address] [place] [address] Phone: [phone] Subjective:   I Ronelle Nigh am serving as a Neurosurgeon for Dr. Antoine Primas.  This visit occurred during the SARS-CoV-2 public health emergency.  Safety protocols were in place, including screening questions prior to the visit, additional usage of staff PPE, and extensive cleaning of exam room while observing appropriate contact time as indicated for disinfecting solutions.   I'm seeing this patient by the request  of:  Emi Belfast, FNP (Inactive)  CC: knee pain   [id number]  Allison Kline is a 65 y.o. female coming in with complaint of knee pain. 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 States she locks her left knee and puts her weight on the left leg. Works 10.5 hours a day 3 days in a row. Would like conservative treatment/ exercises.   Onset- 3/4 Location - Posterior knee  Duration-  Character-more of an aching sensation. Aggravating factors- Getting into a car, flexion  Reliving factors-  Therapies tried- heat, ice, deep blue Severity- 4/10 at its worse Does seem to get better when patient is not working.    Past Medical History:  Diagnosis Date  . Cataract    Past Surgical History:  Procedure Laterality Date  . BREAST BIOPSY Right 2015   Social History   Socioeconomic History  . Marital status: Married    Spouse name: Not on file  . Number of children: Not on file  . Years of education: Not on file  . Highest education level: Not on file  Occupational History  . Not on file  Tobacco Use  . Smoking status: Never Smoker  . Smokeless tobacco: Never Used  Substance and Sexual Activity  . Alcohol use: No  . Drug use: No  . Sexual activity: Not on file  Other Topics Concern  . Not on file  Social History Narrative  . Not on file   Social Determinants of Health   Financial Resource Strain: Not on file  Food Insecurity: Not on file   Transportation Needs: Not on file  Physical Activity: Not on file  Stress: Not on file  Social Connections: Not on file   No Known Allergies Family History  Problem Relation Age of Onset  . Breast cancer Maternal Grandmother   . Heart disease Mother   . Hyperlipidemia Father   . Hypertension Father   . Heart disease Father   . Diabetes Brother     Current Outpatient Medications (Endocrine & Metabolic):  Marland Kitchen  OSPHENA 60 MG TABS,       Current Outpatient Medications (Other):  Marland Kitchen  DUREZOL 0.05 % EMUL, instill 1 drop IN EACH EYE every 2 hours FOR 24 hours, THEN instill 1 drop IN EACH EYE EVERY 4 HOURS .  FLORICAL 8.3-364 MG CAPS capsule, TAKE 1 CAPSULE BY MOUTH 2 TIMES DAILY WITH AS VITAMIN d daily   Reviewed prior external information including notes and imaging from  primary care provider As well as notes that were available from care everywhere and other healthcare systems.  Past medical history, social, surgical and family history all reviewed in electronic medical record.  No pertanent information unless stated regarding to the chief complaint.   Review of Systems:  No headache, visual changes, nausea, vomiting, diarrhea, constipation, dizziness, abdominal pain, skin rash, fevers, chills, night sweats, weight loss, swollen lymph nodes, body aches, joint swelling, chest pain, shortness of breath, mood changes. POSITIVE muscle aches  Objective  Blood pressure 140/90, pulse 79, height 5' 2.5" (1.588 m), weight 136 lb (61.7 kg), SpO2 99 %.   General: No apparent distress alert and oriented x3 mood and affect normal, dressed appropriately.  HEENT: Pupils equal, extraocular movements intact  Respiratory: Patient's speak in full sentences and does not appear short of breath  Cardiovascular: No lower extremity edema, non tender, no erythema  Gait normal with good balance and coordination.  MSK: Patient's left knee has very mild arthritic changes noted.  Very minimal instability  with valgus and varus force.  Mild positive McMurray's noted.  Full range of motion of the knee noted.  No true masses appreciated in the popliteal area. Contralateral knee mild arthritic changes also noted.  Limited musculoskeletal ultrasound was performed and interpreted by Judi Saa  Limited ultrasound of patient's left knee shows that patient does have mild to moderate narrowing of the patellofemoral joint.  Patient does have an acute on chronic mildly displaced lateral meniscus tear noted more posterior.  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

## 2021-01-01 ENCOUNTER — Ambulatory Visit: Payer: 59 | Admitting: Family Medicine

## 2021-01-01 ENCOUNTER — Ambulatory Visit (INDEPENDENT_AMBULATORY_CARE_PROVIDER_SITE_OTHER): Payer: 59 | Admitting: Family Medicine

## 2021-01-01 ENCOUNTER — Encounter: Payer: Self-pay | Admitting: Family Medicine

## 2021-01-01 ENCOUNTER — Ambulatory Visit: Payer: Self-pay

## 2021-01-01 ENCOUNTER — Other Ambulatory Visit: Payer: Self-pay

## 2021-01-01 ENCOUNTER — Ambulatory Visit (INDEPENDENT_AMBULATORY_CARE_PROVIDER_SITE_OTHER): Payer: 59

## 2021-01-01 VITALS — BP 140/90 | HR 79 | Ht 62.5 in | Wt 136.0 lb

## 2021-01-01 DIAGNOSIS — G8929 Other chronic pain: Secondary | ICD-10-CM

## 2021-01-01 DIAGNOSIS — S83282A Other tear of lateral meniscus, current injury, left knee, initial encounter: Secondary | ICD-10-CM | POA: Diagnosis not present

## 2021-01-01 DIAGNOSIS — M2550 Pain in unspecified joint: Secondary | ICD-10-CM

## 2021-01-01 DIAGNOSIS — N952 Postmenopausal atrophic vaginitis: Secondary | ICD-10-CM | POA: Insufficient documentation

## 2021-01-01 DIAGNOSIS — M25562 Pain in left knee: Secondary | ICD-10-CM

## 2021-01-01 DIAGNOSIS — N950 Postmenopausal bleeding: Secondary | ICD-10-CM | POA: Insufficient documentation

## 2021-01-01 DIAGNOSIS — G479 Sleep disorder, unspecified: Secondary | ICD-10-CM | POA: Insufficient documentation

## 2021-01-01 DIAGNOSIS — F419 Anxiety disorder, unspecified: Secondary | ICD-10-CM | POA: Insufficient documentation

## 2021-01-01 LAB — SEDIMENTATION RATE: Sed Rate: 13 mm/hr (ref 0–30)

## 2021-01-01 NOTE — Patient Instructions (Addendum)
Good to see you Knee exercises Labs today Compression sleeve Ice 20 mins 2 times a day No pushing motions ok to work out [employer] today Pennsaid 2 times a day small amount or use voltaren gel over the counter See me again in 5-6 weeks

## 2021-01-01 NOTE — Assessment & Plan Note (Signed)
Patient has what appears to be an acute lateral meniscal tear noted.  We will get a D-dimer secondary to the acuteness of this injury.  Patient will get x-rays and does have some underlying arthritic changes noted.  Do believe though that patient has an acute lateral meniscus tear and does have a chronic medial meniscal tear.  Home exercises given, discussed compression, topical anti-inflammatories.  Follow-up with me again in 4 to 6 weeks.  If patient has any worsening pain or no improvement will consider injections or possibly formal physical therapy.  Follow-up with me again as stated in 4 to 6 weeks

## 2021-01-02 LAB — D-DIMER, QUANTITATIVE: D-Dimer, Quant: 0.27 mcg/mL FEU (ref <0.50)

## 2021-01-26 NOTE — Progress Notes (Signed)
Subjective:    Patient ID: Allison Kline, female    DOB: 06-21-1956, 65 y.o.   MRN: [id number]   This visit occurred during the SARS-CoV-2 public health emergency.  Safety protocols were in place, including screening questions prior to the visit, additional usage of staff PPE, and extensive cleaning of exam room while observing appropriate contact time as indicated for disinfecting solutions.    HPI She is here for a physical exam.   She is here to establish with a new primary care physician.  She has not been following with a PCP on a regular basis.  She feels she is getting older and should establish with one, but overall she is very healthy.   Medications and allergies reviewed with patient and updated if appropriate.  Patient Active Problem List   Diagnosis Date Noted  . Hearing loss 01/27/2021  . Osteoporosis 01/27/2021  . Anxiety 01/01/2021  . 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 Postmenopausal bleeding 01/01/2021  . Sleep disturbance 01/01/2021  . Acute lateral meniscal tear, left, initial encounter 01/01/2021    Current Outpatient Medications on File Prior to Visit  Medication Sig Dispense Refill  . FLORICAL 8.3-364 MG CAPS capsule TAKE 1 CAPSULE BY MOUTH 2 TIMES DAILY WITH AS VITAMIN d daily  3   No current facility-administered medications on file prior to visit.    Past Medical History:  Diagnosis Date  . Cataract     Past Surgical History:  Procedure Laterality Date  . BREAST BIOPSY Right 2015    Social History   Socioeconomic History  . Marital status: Married    Spouse name: Not on file  . Number of children: Not on file  . Years of education: Not on file  . Highest education level: Not on file  Occupational History  . Not on file  Tobacco Use  . Smoking status: Never Smoker  . Smokeless tobacco: Never Used  Substance and Sexual Activity  . Alcohol use: No  . Drug use: No  . Sexual activity: Not on file  Other Topics Concern  . Not on file   Social History Narrative  . Not on file   Social Determinants of Health   Financial Resource Strain: Not on file  Food Insecurity: Not on file  Transportation Needs: Not on file  Physical Activity: Not on file  Stress: Not on file  Social Connections: Not on file    Family History  Problem Relation Age of Onset  . Breast cancer Maternal Grandmother   . Cancer Maternal Grandmother   . Heart disease Mother   . Atrial fibrillation Mother   . Hyperlipidemia Father   . Hypertension Father   . Heart disease Father   . Diabetes Brother     Review of Systems  Constitutional: Negative for chills and fever.  Eyes: Negative for visual disturbance.  Respiratory: Negative for cough, shortness of breath and wheezing.   Cardiovascular: Negative for chest pain, palpitations and leg swelling.  Gastrointestinal: Negative for abdominal pain, blood in stool, constipation, diarrhea and nausea.       No gerd  Genitourinary: Negative for dysuria and hematuria.  Musculoskeletal: Positive for arthralgias (left knee - torn meniscus). Negative for back pain.  Skin: Negative for color change and rash.  Neurological: Positive for headaches (occ - work related). Negative for dizziness and light-headedness.  Psychiatric/Behavioral: Positive for sleep disturbance. Negative for dysphoric mood. The patient is nervous/anxious.        Objective:   Vitals:  01/27/21 1355  BP: 130/74  Pulse: 82  Temp: 98.6 F (37 C)  SpO2: 97%   Filed Weights   01/27/21 1355  Weight: 134 lb 9.6 oz (61.1 kg)   Body mass index is 24.23 kg/m.  BP Readings from Last 3 Encounters:  01/27/21 130/74  01/01/21 140/90  02/20/18 140/64    Wt Readings from Last 3 Encounters:  01/27/21 134 lb 9.6 oz (61.1 kg)  01/01/21 136 lb (61.7 kg)  09/17/17 131 lb (59.4 kg)    Depression screen PHQ 2/9 01/27/2021  Decreased Interest 0  Down, Depressed, Hopeless 0  PHQ - 2 Score 0  Altered sleeping 1  Tired, decreased  energy 0  Change in appetite 0  Feeling bad or failure about yourself  0  Trouble concentrating 0  Moving slowly or fidgety/restless 0  Suicidal thoughts 0  PHQ-9 Score 1  Difficult doing work/chores Not difficult at all       GAD 7 : Generalized Anxiety Score 01/27/2021  Nervous, Anxious, on Edge 0  Control/stop worrying 1  Worry too much - different things 0  Trouble relaxing 0  Restless 0  Easily annoyed or irritable 0  Afraid - awful might happen 0  Total GAD 7 Score 1  Anxiety Difficulty Not difficult at all        Physical Exam Constitutional: She appears well-developed and well-nourished. No distress.  HENT:  Head: Normocephalic and atraumatic.  Right Ear: External ear normal. Normal ear canal and TM Left Ear: External ear normal.  Normal ear canal and TM Mouth/Throat: Oropharynx is clear and moist.  Eyes: Conjunctivae and EOM are normal.  Neck: Neck supple. No tracheal deviation present. No thyromegaly present.  No carotid bruit  Cardiovascular: Normal rate, regular rhythm and normal heart sounds.   No murmur heard.  No edema. Pulmonary/Chest: Effort normal and breath sounds normal. No respiratory distress. She has no wheezes. She has no rales.  Abdominal: Soft. She exhibits no distension. There is no tenderness.  Lymphadenopathy: She has no cervical adenopathy.  Skin: Skin is warm and dry. She is not diaphoretic.  Psychiatric: She has a normal mood and affect. Her behavior is normal.        Assessment & Plan:   Physical exam: Screening blood work    ordered Immunizations  Up to date-has had all Colonoscopy  [facility]  - due-she will see who is covered by her insurance and let me know if she needs a referral Mammogram   Up to date  Gyn  Up to date  Dexa  Up to date  Eye exams   Up to date  Exercise  Regular  Weight  Normal.  Substance abuse  none   Screened for depression using the PHQ 9 scale.  No evidence of depression.   Screened for anxiety using  GAD7 Scale.  No evidence of anxiety.  She does worry some and that is related to her 83 year old daughter.  Other than that she does not feel anxious or depressed.   See Problem List for Assessment and Plan of chronic medical problems.

## 2021-01-27 ENCOUNTER — Ambulatory Visit (INDEPENDENT_AMBULATORY_CARE_PROVIDER_SITE_OTHER): Payer: 59 | Admitting: Internal Medicine

## 2021-01-27 ENCOUNTER — Encounter: Payer: Self-pay | Admitting: Internal Medicine

## 2021-01-27 ENCOUNTER — Other Ambulatory Visit: Payer: Self-pay

## 2021-01-27 VITALS — BP 130/74 | HR 82 | Temp 98.6°F | Ht 62.5 in | Wt 134.6 lb

## 2021-01-27 DIAGNOSIS — H9190 Unspecified hearing loss, unspecified ear: Secondary | ICD-10-CM

## 2021-01-27 DIAGNOSIS — Z0000 Encounter for general adult medical examination without abnormal findings: Secondary | ICD-10-CM | POA: Diagnosis not present

## 2021-01-27 DIAGNOSIS — M810 Age-related osteoporosis without current pathological fracture: Secondary | ICD-10-CM

## 2021-01-27 DIAGNOSIS — F419 Anxiety disorder, unspecified: Secondary | ICD-10-CM | POA: Diagnosis not present

## 2021-01-27 LAB — TSH: TSH: 1.16 u[IU]/mL (ref 0.35–4.50)

## 2021-01-27 LAB — LIPID PANEL
Cholesterol: 219 mg/dL — ABNORMAL HIGH (ref 0–200)
HDL: 89.2 mg/dL (ref >39.00)
LDL Cholesterol: 116 mg/dL — ABNORMAL HIGH (ref 0–99)
NonHDL: 129.71
Total CHOL/HDL Ratio: 2
Triglycerides: 70 mg/dL (ref 0.0–149.0)
VLDL: 14 mg/dL (ref 0.0–40.0)

## 2021-01-27 LAB — COMPREHENSIVE METABOLIC PANEL
ALT: 11 U/L (ref 0–35)
AST: 20 U/L (ref 0–37)
Albumin: 4.1 g/dL (ref 3.5–5.2)
Alkaline Phosphatase: 75 U/L (ref 39–117)
BUN: 14 mg/dL (ref 6–23)
CO2: 29 mEq/L (ref 19–32)
Calcium: 9.2 mg/dL (ref 8.4–10.5)
Chloride: 103 mEq/L (ref 96–112)
Creatinine, Ser: 0.75 mg/dL (ref 0.40–1.20)
GFR: 84.73 mL/min (ref >60.00)
Glucose, Bld: 87 mg/dL (ref 70–99)
Potassium: 3.8 mEq/L (ref 3.5–5.1)
Sodium: 139 mEq/L (ref 135–145)
Total Bilirubin: 0.5 mg/dL (ref 0.2–1.2)
Total Protein: 6.8 g/dL (ref 6.0–8.3)

## 2021-01-27 LAB — CBC WITH DIFFERENTIAL/PLATELET
Basophils Absolute: 0 10*3/uL (ref 0.0–0.1)
Basophils Relative: 0.7 % (ref 0.0–3.0)
Eosinophils Absolute: 0.1 10*3/uL (ref 0.0–0.7)
Eosinophils Relative: 0.8 % (ref 0.0–5.0)
HCT: 38.7 % (ref 36.0–46.0)
Hemoglobin: 13 g/dL (ref 12.0–15.0)
Lymphocytes Relative: 35.6 % (ref 12.0–46.0)
Lymphs Abs: 2.3 10*3/uL (ref 0.7–4.0)
MCHC: 33.6 g/dL (ref 30.0–36.0)
MCV: 88.6 fl (ref 78.0–100.0)
Monocytes Absolute: 0.3 10*3/uL (ref 0.1–1.0)
Monocytes Relative: 4.9 % (ref 3.0–12.0)
Neutro Abs: 3.7 10*3/uL (ref 1.4–7.7)
Neutrophils Relative %: 58 % (ref 43.0–77.0)
Platelets: 302 10*3/uL (ref 150.0–400.0)
RBC: 4.36 Mil/uL (ref 3.87–5.11)
RDW: 13.9 % (ref 11.5–15.5)
WBC: 6.4 10*3/uL (ref 4.0–10.5)

## 2021-01-27 LAB — VITAMIN D 25 HYDROXY (VIT D DEFICIENCY, FRACTURES): VITD: 55.83 ng/mL (ref 30.00–100.00)

## 2021-01-27 MED ORDER — VITAMIN D3 125 MCG (5000 UT) PO CAPS: 5000.0000 [IU] | ORAL_CAPSULE | Freq: Every day | ORAL | Status: AC

## 2021-01-27 NOTE — Assessment & Plan Note (Signed)
Chronic DEXA up-to-date She would like to avoid medication at this time.  She does plan on having a dental implant and could not take it at this time regardless She will try increasing her calcium and continue her vitamin D daily Check vitamin D level and other basic blood work Continue regular exercise

## 2021-01-27 NOTE — Patient Instructions (Signed)
Blood work was ordered.     Medications changes include :   none     Please followup in 1 year    Health Maintenance, Female Adopting a healthy lifestyle and getting preventive care are important in promoting health and wellness. Ask your health care provider about:  The right schedule for you to have regular tests and exams.  Things you can do on your own to prevent diseases and keep yourself healthy. What should I know about diet, weight, and exercise? Eat a healthy diet  Eat a diet that includes plenty of vegetables, fruits, low-fat dairy products, and lean protein.  Do not eat a lot of foods that are high in solid fats, added sugars, or sodium.   Maintain a healthy weight Body mass index (BMI) is used to identify weight problems. It estimates body fat based on height and weight. Your health care provider can help determine your BMI and help you achieve or maintain a healthy weight. Get regular exercise Get regular exercise. This is one of the most important things you can do for your health. Most adults should:  Exercise for at least 150 minutes each week. The exercise should increase your heart rate and make you sweat (moderate-intensity exercise).  Do strengthening exercises at least twice a week. This is in addition to the moderate-intensity exercise.  Spend less time sitting. Even light physical activity can be beneficial. Watch cholesterol and blood lipids Have your blood tested for lipids and cholesterol at 64 years of age, then have this test every 5 years. Have your cholesterol levels checked more often if:  Your lipid or cholesterol levels are high.  You are older than 64 years of age.  You are at high risk for heart disease. What should I know about cancer screening? Depending on your health history and family history, you may need to have cancer screening at various ages. This may include screening for:  Breast cancer.  Cervical cancer.  Colorectal  cancer.  Skin cancer.  Lung cancer. What should I know about heart disease, diabetes, and high blood pressure? Blood pressure and heart disease  High blood pressure causes heart disease and increases the risk of stroke. This is more likely to develop in people who have high blood pressure readings, are of African descent, or are overweight.  Have your blood pressure checked: ? Every 3-5 years if you are 18-39 years of age. ? Every year if you are 40 years old or older. Diabetes Have regular diabetes screenings. This checks your fasting blood sugar level. Have the screening done:  Once every three years after age 40 if you are at a normal weight and have a low risk for diabetes.  More often and at a younger age if you are overweight or have a high risk for diabetes. What should I know about preventing infection? Hepatitis B If you have a higher risk for hepatitis B, you should be screened for this virus. Talk with your health care provider to find out if you are at risk for hepatitis B infection. Hepatitis C Testing is recommended for:  Everyone born from 1945 through 1965.  Anyone with known risk factors for hepatitis C. Sexually transmitted infections (STIs)  Get screened for STIs, including gonorrhea and chlamydia, if: ? You are sexually active and are younger than 64 years of age. ? You are older than 64 years of age and your health care provider tells you that you are at risk for this type of   infection. ? Your sexual activity has changed since you were last screened, and you are at increased risk for chlamydia or gonorrhea. Ask your health care provider if you are at risk.  Ask your health care provider about whether you are at high risk for HIV. Your health care provider may recommend a prescription medicine to help prevent HIV infection. If you choose to take medicine to prevent HIV, you should first get tested for HIV. You should then be tested every 3 months for as long as  you are taking the medicine. Pregnancy  If you are about to stop having your period (premenopausal) and you may become pregnant, seek counseling before you get pregnant.  Take 400 to 800 micrograms (mcg) of folic acid every day if you become pregnant.  Ask for birth control (contraception) if you want to prevent pregnancy. Osteoporosis and menopause Osteoporosis is a disease in which the bones lose minerals and strength with aging. This can result in bone fractures. If you are 65 years old or older, or if you are at risk for osteoporosis and fractures, ask your health care provider if you should:  Be screened for bone loss.  Take a calcium or vitamin D supplement to lower your risk of fractures.  Be given hormone replacement therapy (HRT) to treat symptoms of menopause. Follow these instructions at home: Lifestyle  Do not use any products that contain nicotine or tobacco, such as cigarettes, e-cigarettes, and chewing tobacco. If you need help quitting, ask your health care provider.  Do not use street drugs.  Do not share needles.  Ask your health care provider for help if you need support or information about quitting drugs. Alcohol use  Do not drink alcohol if: ? Your health care provider tells you not to drink. ? You are pregnant, may be pregnant, or are planning to become pregnant.  If you drink alcohol: ? Limit how much you use to 0-1 drink a day. ? Limit intake if you are breastfeeding.  Be aware of how much alcohol is in your drink. In the [place] one drink equals one 12 oz bottle of beer (355 mL), one 5 oz glass of wine (148 mL), or one 1 oz glass of hard liquor (44 mL). General instructions  Schedule regular health, dental, and eye exams.  Stay current with your vaccines.  Tell your health care provider if: ? You often feel depressed. ? You have ever been abused or do not feel safe at home. Summary  Adopting a healthy lifestyle and getting preventive care are  important in promoting health and wellness.  Follow your health care provider's instructions about healthy diet, exercising, and getting tested or screened for diseases.  Follow your health care provider's instructions on monitoring your cholesterol and blood pressure. This information is not intended to replace advice given to you by your health care provider. Make sure you discuss any questions you have with your health care provider. Document Revised: 09/28/2018 Document Reviewed: 09/28/2018 Elsevier Patient Education  2021 [employer]  

## 2021-01-27 NOTE — Assessment & Plan Note (Signed)
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

## 2021-01-27 NOTE — Assessment & Plan Note (Signed)
Chronic Mild Related mostly to her daughter-she worries about her, no generalized anxiety

## 2021-02-09 NOTE — Progress Notes (Signed)
[facility] [address] [place] [address] Phone: [phone] Subjective:   Allison Kline, am serving as a scribe for Dr. Antoine Primas. This visit occurred during the SARS-CoV-2 public health emergency.  Safety protocols were in place, including screening questions prior to the visit, additional usage of staff PPE, and extensive cleaning of exam room while observing appropriate contact time as indicated for disinfecting solutions.   I'm seeing this patient by the request  of:  Pincus Sanes, MD  CC: left knee pain follow up   MOQ:[id number]  Allison Kline is a 65 y.o. female coming in with complaint of left knee pain. Feels like exercises and knee sleeve have helped her pain. Patient does PureBar and this does not bother her. Pain with standing at work has improved.   Found to have a lateral meniscus tear  ? Need for injection or PT      Past Medical History:  Diagnosis Date  . Cataract    Past Surgical History:  Procedure Laterality Date  . BREAST BIOPSY Right 2015   Social History   Socioeconomic History  . Marital status: Married    Spouse name: Not on file  . Number of children: Not on file  . Years of education: Not on file  . Highest education level: Not on file  Occupational History  . Not on file  Tobacco Use  . Smoking status: Never Smoker  . Smokeless tobacco: Never Used  Substance and Sexual Activity  . Alcohol use: No  . Drug use: No  . Sexual activity: Not on file  Other Topics Concern  . Not on file  Social History Narrative  . Not on file   Social Determinants of Health   Financial Resource Strain: Not on file  Food Insecurity: Not on file  Transportation Needs: Not on file  Physical Activity: Not on file  Stress: Not on file  Social Connections: Not on file   No Known Allergies Family History  Problem Relation Age of Onset  . Breast cancer Maternal Grandmother   . Cancer Maternal Grandmother   .  Heart disease Mother   . Atrial fibrillation Mother   . Hyperlipidemia Father   . Hypertension Father   . Heart disease Father   . Diabetes Brother          Current Outpatient Medications (Other):  Marland Kitchen  Cholecalciferol (VITAMIN D3) 125 MCG (5000 UT) CAPS, Take 1 capsule (5,000 Units total) by mouth daily. Marland Kitchen  FLORICAL 8.3-364 MG CAPS capsule, TAKE 1 CAPSULE BY MOUTH 2 TIMES DAILY WITH AS VITAMIN d daily   Reviewed prior external information including notes and imaging from  primary care provider As well as notes that were available from care everywhere and other healthcare systems.  Past medical history, social, surgical and family history all reviewed in electronic medical record.  No pertanent information unless stated regarding to the chief complaint.   Review of Systems:  No headache, visual changes, nausea, vomiting, diarrhea, constipation, dizziness, abdominal pain, skin rash, fevers, chills, night sweats, weight loss, swollen lymph nodes, body aches, joint swelling, chest pain, shortness of breath, mood changes. POSITIVE muscle aches  Objective  Blood pressure 118/74, pulse 85, height 5' 2.5" (1.588 m), weight 135 lb (61.2 kg), SpO2 97 %.   General: No apparent distress alert and oriented x3 mood and affect normal, dressed appropriately.  HEENT: Pupils equal, extraocular movements intact  Respiratory: Patient's speak in full sentences and does  not appear short of breath  Cardiovascular: No lower extremity edema, non tender, no erythema  patient's left knee seems to be doing relatively well.  Negative McMurray's.  Patient does have very mild pain on the medial and lateral joint space.  Full range of motion.  No significant abnormality gait noted.  Limited musculoskeletal ultrasound was performed and interpreted by Judi Saa  Limited ultrasound shows the patient does have still some degenerative changes of the patellofemoral joint.  Patient's medial meniscus does have a  tear noted with some mild displacement noted.  Lateral meniscus though does have significant decrease in hypoechoic changes from previous exam   Impression and Recommendations:     The above documentation has been reviewed and is accurate and complete Judi Saa, DO

## 2021-02-10 ENCOUNTER — Ambulatory Visit: Payer: Self-pay

## 2021-02-10 ENCOUNTER — Encounter: Payer: Self-pay | Admitting: Family Medicine

## 2021-02-10 ENCOUNTER — Other Ambulatory Visit: Payer: Self-pay

## 2021-02-10 ENCOUNTER — Ambulatory Visit (INDEPENDENT_AMBULATORY_CARE_PROVIDER_SITE_OTHER): Payer: 59 | Admitting: Family Medicine

## 2021-02-10 VITALS — BP 118/74 | HR 85 | Ht 62.5 in | Wt 135.0 lb

## 2021-02-10 DIAGNOSIS — M25562 Pain in left knee: Secondary | ICD-10-CM

## 2021-02-10 DIAGNOSIS — S83282A Other tear of lateral meniscus, current injury, left knee, initial encounter: Secondary | ICD-10-CM

## 2021-02-10 NOTE — Assessment & Plan Note (Signed)
Patient has made significant improvement already at this time.  Patient still has some instability noted of the knee with McMurray's but very minimal.  Patient feels like she has made significant progress and would not want to change management.  Patient will follow up with me in 2 months to make sure it completely resolves.  Can call me sooner if any worsening pain or loss encouraged.

## 2021-02-10 NOTE — Patient Instructions (Signed)
Ice after activity Continue compression Turmeric 500mg  daily  Tart cherry extract 1200mg  at night Vitamin D 2000 IU daily  See me again in 2 months if not perfect

## 2021-04-14 ENCOUNTER — Ambulatory Visit: Payer: 59 | Admitting: Family Medicine

## 2021-06-09 ENCOUNTER — Other Ambulatory Visit: Payer: Self-pay | Admitting: Nurse Practitioner

## 2021-06-09 DIAGNOSIS — Z1231 Encounter for screening mammogram for malignant neoplasm of breast: Secondary | ICD-10-CM

## 2021-07-28 ENCOUNTER — Ambulatory Visit: Payer: 59

## 2021-08-01 ENCOUNTER — Ambulatory Visit
Admission: RE | Admit: 2021-08-01 | Discharge: 2021-08-01 | Disposition: A | Discharge status: Home / Self Care | Payer: 59 | Source: Ambulatory Visit | Attending: Nurse Practitioner | Admitting: Nurse Practitioner

## 2021-08-01 ENCOUNTER — Other Ambulatory Visit: Payer: Self-pay

## 2021-08-01 DIAGNOSIS — Z1231 Encounter for screening mammogram for malignant neoplasm of breast: Secondary | ICD-10-CM

## 2021-12-18 DIAGNOSIS — H8023 Cochlear otosclerosis, bilateral: Secondary | ICD-10-CM | POA: Insufficient documentation

## 2021-12-18 DIAGNOSIS — H903 Sensorineural hearing loss, bilateral: Secondary | ICD-10-CM | POA: Insufficient documentation

## 2022-02-01 ENCOUNTER — Encounter: Payer: Self-pay | Admitting: Internal Medicine

## 2022-02-01 NOTE — Patient Instructions (Addendum)
? ?An EKG was done ? ? ?Blood work was ordered.   ? ? ?Medications changes include :  none  ? ? ? ?Return in about 1 year (around 02/03/2023) for Physical Exam. ? ? ?Health Maintenance, Female ?Adopting a healthy lifestyle and getting preventive care are important in promoting health and wellness. Ask your health care provider about: ?The right schedule for you to have regular tests and exams. ?Things you can do on your own to prevent diseases and keep yourself healthy. ?What should I know about diet, weight, and exercise? ?Eat a healthy diet ? ?Eat a diet that includes plenty of vegetables, fruits, low-fat dairy products, and lean protein. ?Do not eat a lot of foods that are high in solid fats, added sugars, or sodium. ?Maintain a healthy weight ?Body mass index (BMI) is used to identify weight problems. It estimates body fat based on height and weight. Your health care provider can help determine your BMI and help you achieve or maintain a healthy weight. ?Get regular exercise ?Get regular exercise. This is one of the most important things you can do for your health. Most adults should: ?Exercise for at least 150 minutes each week. The exercise should increase your heart rate and make you sweat (moderate-intensity exercise). ?Do strengthening exercises at least twice a week. This is in addition to the moderate-intensity exercise. ?Spend less time sitting. Even light physical activity can be beneficial. ?Watch cholesterol and blood lipids ?Have your blood tested for lipids and cholesterol at 65 years of age, then have this test every 5 years. ?Have your cholesterol levels checked more often if: ?Your lipid or cholesterol levels are high. ?You are older than 65 years of age. ?You are at high risk for heart disease. ?What should I know about cancer screening? ?Depending on your health history and family history, you may need to have cancer screening at various ages. This may include screening for: ?Breast  cancer. ?Cervical cancer. ?Colorectal cancer. ?Skin cancer. ?Lung cancer. ?What should I know about heart disease, diabetes, and high blood pressure? ?Blood pressure and heart disease ?High blood pressure causes heart disease and increases the risk of stroke. This is more likely to develop in people who have high blood pressure readings or are overweight. ?Have your blood pressure checked: ?Every 3-5 years if you are 32-54 years of age. ?Every year if you are 36 years old or older. ?Diabetes ?Have regular diabetes screenings. This checks your fasting blood sugar level. Have the screening done: ?Once every three years after age 50 if you are at a normal weight and have a low risk for diabetes. ?More often and at a younger age if you are overweight or have a high risk for diabetes. ?What should I know about preventing infection? ?Hepatitis B ?If you have a higher risk for hepatitis B, you should be screened for this virus. Talk with your health care provider to find out if you are at risk for hepatitis B infection. ?Hepatitis C ?Testing is recommended for: ?Everyone born from [birth year] through 1964. ?Anyone with known risk factors for hepatitis C. ?Sexually transmitted infections (STIs) ?Get screened for STIs, including gonorrhea and chlamydia, if: ?You are sexually active and are younger than 65 years of age. ?You are older than 65 years of age and your health care provider tells you that you are at risk for this type of infection. ?Your sexual activity has changed since you were last screened, and you are at increased risk for chlamydia or gonorrhea.  Ask your health care provider if you are at risk. ?Ask your health care provider about whether you are at high risk for HIV. Your health care provider may recommend a prescription medicine to help prevent HIV infection. If you choose to take medicine to prevent HIV, you should first get tested for HIV. You should then be tested every 3 months for as long as you are taking  the medicine. ?Pregnancy ?If you are about to stop having your period (premenopausal) and you may become pregnant, seek counseling before you get pregnant. ?Take 400 to 800 micrograms (mcg) of folic acid every day if you become pregnant. ?Ask for birth control (contraception) if you want to prevent pregnancy. ?Osteoporosis and menopause ?Osteoporosis is a disease in which the bones lose minerals and strength with aging. This can result in bone fractures. If you are 75 years old or older, or if you are at risk for osteoporosis and fractures, ask your health care provider if you should: ?Be screened for bone loss. ?Take a calcium or vitamin D supplement to lower your risk of fractures. ?Be given hormone replacement therapy (HRT) to treat symptoms of menopause. ?Follow these instructions at home: ?Alcohol use ?Do not drink alcohol if: ?Your health care provider tells you not to drink. ?You are pregnant, may be pregnant, or are planning to become pregnant. ?If you drink alcohol: ?Limit how much you have to: ?0-1 drink a day. ?Know how much alcohol is in your drink. In the [place] one drink equals one 12 oz bottle of beer (355 mL), one 5 oz glass of wine (148 mL), or one 1? oz glass of hard liquor (44 mL). ?Lifestyle ?Do not use any products that contain nicotine or tobacco. These products include cigarettes, chewing tobacco, and vaping devices, such as e-cigarettes. If you need help quitting, ask your health care provider. ?Do not use street drugs. ?Do not share needles. ?Ask your health care provider for help if you need support or information about quitting drugs. ?General instructions ?Schedule regular health, dental, and eye exams. ?Stay current with your vaccines. ?Tell your health care provider if: ?You often feel depressed. ?You have ever been abused or do not feel safe at home. ?Summary ?Adopting a healthy lifestyle and getting preventive care are important in promoting health and wellness. ?Follow your health  care provider's instructions about healthy diet, exercising, and getting tested or screened for diseases. ?Follow your health care provider's instructions on monitoring your cholesterol and blood pressure. ?This information is not intended to replace advice given to you by your health care provider. Make sure you discuss any questions you have with your health care provider. ?Document Revised: 02/24/2021 Document Reviewed: 02/24/2021 ?Elsevier Patient Education ? [place]. ? ?

## 2022-02-01 NOTE — Progress Notes (Signed)
? ? ?Subjective:  ? ? Patient ID: Allison Kline, female    DOB: [birth year], 66 y.o.   MRN: [id number] ? ? ?This visit occurred during the SARS-CoV-2 public health emergency.  Safety protocols were in place, including screening questions prior to the visit, additional usage of staff PPE, and extensive cleaning of exam room while observing appropriate contact time as indicated for disinfecting solutions. ? ? ? ?HPI ?Allison Kline is here for  ?Chief Complaint  ?Patient presents with  ? Annual Exam  ? Physical  ?  Muscle pain in right thigh  ? New meds  ?  Patient says she is taking Tumeric 500mg  tqd and Biotin 10,071mcg  ?tqd  ? ? ?Soreness in right upper lateral thigh.  She walks.  It is ok with walking, pur bare.  It bothers her after sitting for a while.  It started in Nov - got better.  The past couple of weeks it has started to bother her.  ? ? ?She does pur bar or walks.  Her right leg is a little weaker when she does planks.  No pain in lower back or buttock.  Has been doing more the past couple of weeks.  No n/T in the leg.   ? ?Two weeks ago she she did do more sitting when she visited her parents.  She wonders if that flared it up. ? ?She does note that stretching helps. ? ? ?Medications and allergies reviewed with patient and updated if appropriate. ? ? ?Current Outpatient Medications on File Prior to Visit  ?Medication Sig Dispense Refill  ? Cholecalciferol (VITAMIN D3) 125 MCG (5000 UT) CAPS Take 1 capsule (5,000 Units total) by mouth daily. 30 capsule   ? FLORICAL 8.3-364 MG CAPS capsule TAKE 1 CAPSULE BY MOUTH 2 TIMES DAILY WITH AS VITAMIN d daily  3  ? ?No current facility-administered medications on file prior to visit.  ? ? ?Review of Systems  ?Constitutional:  Negative for fever.  ?Eyes:  Negative for visual disturbance.  ?Respiratory:  Negative for cough, shortness of breath and wheezing.   ?Cardiovascular:  Negative for chest pain, palpitations and leg swelling.  ?Gastrointestinal:  Negative for abdominal  pain, blood in stool, constipation, diarrhea and nausea.  ?     No gerd  ?Genitourinary:  Negative for dysuria.  ?Musculoskeletal:  Positive for arthralgias (mild). Negative for back pain.  ?Skin:  Negative for rash.  ?Neurological:  Positive for headaches. Negative for light-headedness.  ?Psychiatric/Behavioral:  Positive for sleep disturbance. Negative for dysphoric mood. The patient is nervous/anxious.   ? ?   ?Objective:  ? ?Vitals:  ? 02/02/22 1259  ?BP: 128/76  ?Pulse: 72  ?Temp: 98.3 ?F (36.8 ?C)  ?SpO2: 96%  ? ?Filed Weights  ? 02/02/22 1259  ?Weight: 137 lb (62.1 kg)  ? ?Body mass index is 24.66 kg/m?. ? ?BP Readings from Last 3 Encounters:  ?02/02/22 128/76  ?02/10/21 118/74  ?01/27/21 130/74  ? ? ?Wt Readings from Last 3 Encounters:  ?02/02/22 137 lb (62.1 kg)  ?02/10/21 135 lb (61.2 kg)  ?01/27/21 134 lb 9.6 oz (61.1 kg)  ? ? ? ?  02/02/2022  ?  1:04 PM 01/27/2021  ?  4:44 PM  ?Depression screen PHQ 2/9  ?Decreased Interest 0 0  ?Down, Depressed, Hopeless 0 0  ?PHQ - 2 Score 0 0  ?Altered sleeping  1  ?Tired, decreased energy  0  ?Change in appetite  0  ?Feeling bad or failure about yourself  0  ?Trouble concentrating  0  ?Moving slowly or fidgety/restless  0  ?Suicidal thoughts  0  ?PHQ-9 Score  1  ?Difficult doing work/chores  Not difficult at all  ? ? ? ? ?  01/27/2021  ?  4:44 PM  ?GAD 7 : Generalized Anxiety Score  ?Nervous, Anxious, on Edge 0  ?Control/stop worrying 1  ?Worry too much - different things 0  ?Trouble relaxing 0  ?Restless 0  ?Easily annoyed or irritable 0  ?Afraid - awful might happen 0  ?Total GAD 7 Score 1  ?Anxiety Difficulty Not difficult at all  ? ? ? ? ?  ?Physical Exam ?Constitutional: She appears well-developed and well-nourished. No distress.  ?HENT:  ?Head: Normocephalic and atraumatic.  ?Right Ear: External ear normal. Normal ear canal and TM ?Left Ear: External ear normal.  Normal ear canal and TM ?Mouth/Throat: Oropharynx is clear and moist.  ?Eyes: Conjunctivae and EOM  are normal.  ?Neck: Neck supple. No tracheal deviation present. No thyromegaly present.  ?No carotid bruit  ?Cardiovascular: Normal rate, regular rhythm and normal heart sounds.   ?No murmur heard.  No edema. ?Pulmonary/Chest: Effort normal and breath sounds normal. No respiratory distress. She has no wheezes. She has no rales.  ?Breast: deferred   ?Abdominal: Soft. She exhibits no distension. There is no tenderness.  ?Lymphadenopathy: She has no cervical adenopathy.  ?Skin: Skin is warm and dry. She is not diaphoretic.  ?Psychiatric: She has a normal mood and affect. Her behavior is normal.  ? ? ? ?Lab Results  ?Component Value Date  ? WBC 6.4 01/27/2021  ? HGB 13.0 01/27/2021  ? HCT 38.7 01/27/2021  ? PLT 302.0 01/27/2021  ? GLUCOSE 87 01/27/2021  ? CHOL 219 (H) 01/27/2021  ? TRIG 70.0 01/27/2021  ? HDL 89.20 01/27/2021  ? LDLCALC 116 (H) 01/27/2021  ? ALT 11 01/27/2021  ? AST 20 01/27/2021  ? NA 139 01/27/2021  ? K 3.8 01/27/2021  ? CL 103 01/27/2021  ? CREATININE 0.75 01/27/2021  ? BUN 14 01/27/2021  ? CO2 29 01/27/2021  ? TSH 1.16 01/27/2021  ? ? ? ? ?   ?Assessment & Plan:  ? ?Physical exam: ?Screening blood work  ordered ?Exercise regular-pure bar, walking ?Weight normal ?Substance abuse  none ? ? ?Reviewed recommended immunizations. ? ? ?Health Maintenance  ?Topic Date Due  ? COLONOSCOPY (Pts 45-40yrs Insurance coverage will need to be confirmed)  Never done  ? COVID-19 Vaccine (1) 02/18/2022 (Originally 02/21/1957)  ? Hepatitis C Screening  01/27/2054 (Originally 08/25/1975)  ? HIV Screening  01/27/2054 (Originally 08/24/1972)  ? INFLUENZA VACCINE  05/19/2022  ? MAMMOGRAM  08/01/2022  ? DEXA SCAN  08/21/2022  ? PAP SMEAR-Modifier  05/06/2023  ? TETANUS/TDAP  10/20/2023  ? HPV VACCINES  Aged Out  ? Zoster Vaccines- Shingrix  Discontinued  ?  ? ? ? ? ? ? ?See Problem List for Assessment and Plan of chronic medical problems. ? ? ? ? ?

## 2022-02-02 ENCOUNTER — Ambulatory Visit (INDEPENDENT_AMBULATORY_CARE_PROVIDER_SITE_OTHER): Payer: 59 | Admitting: Internal Medicine

## 2022-02-02 VITALS — BP 128/76 | HR 72 | Temp 98.3°F | Ht 62.5 in | Wt 137.0 lb

## 2022-02-02 DIAGNOSIS — M810 Age-related osteoporosis without current pathological fracture: Secondary | ICD-10-CM

## 2022-02-02 DIAGNOSIS — G479 Sleep disorder, unspecified: Secondary | ICD-10-CM

## 2022-02-02 DIAGNOSIS — Z0000 Encounter for general adult medical examination without abnormal findings: Secondary | ICD-10-CM | POA: Diagnosis not present

## 2022-02-02 DIAGNOSIS — R002 Palpitations: Secondary | ICD-10-CM | POA: Diagnosis not present

## 2022-02-02 DIAGNOSIS — F419 Anxiety disorder, unspecified: Secondary | ICD-10-CM

## 2022-02-02 DIAGNOSIS — Z13220 Encounter for screening for lipoid disorders: Secondary | ICD-10-CM

## 2022-02-02 DIAGNOSIS — M791 Myalgia, unspecified site: Secondary | ICD-10-CM | POA: Insufficient documentation

## 2022-02-02 LAB — CBC WITH DIFFERENTIAL/PLATELET
Basophils Absolute: 0.1 10*3/uL (ref 0.0–0.1)
Basophils Relative: 1 % (ref 0.0–3.0)
Eosinophils Absolute: 0.1 10*3/uL (ref 0.0–0.7)
Eosinophils Relative: 1.3 % (ref 0.0–5.0)
HCT: 38.1 % (ref 36.0–46.0)
Hemoglobin: 12.8 g/dL (ref 12.0–15.0)
Lymphocytes Relative: 30.2 % (ref 12.0–46.0)
Lymphs Abs: 1.7 10*3/uL (ref 0.7–4.0)
MCHC: 33.5 g/dL (ref 30.0–36.0)
MCV: 88.3 fl (ref 78.0–100.0)
Monocytes Absolute: 0.3 10*3/uL (ref 0.1–1.0)
Monocytes Relative: 5.8 % (ref 3.0–12.0)
Neutro Abs: 3.5 10*3/uL (ref 1.4–7.7)
Neutrophils Relative %: 61.7 % (ref 43.0–77.0)
Platelets: 281 10*3/uL (ref 150.0–400.0)
RBC: 4.31 Mil/uL (ref 3.87–5.11)
RDW: 13.3 % (ref 11.5–15.5)
WBC: 5.6 10*3/uL (ref 4.0–10.5)

## 2022-02-02 LAB — COMPREHENSIVE METABOLIC PANEL
ALT: 13 U/L (ref 0–35)
AST: 21 U/L (ref 0–37)
Albumin: 4.3 g/dL (ref 3.5–5.2)
Alkaline Phosphatase: 77 U/L (ref 39–117)
BUN: 20 mg/dL (ref 6–23)
CO2: 29 mEq/L (ref 19–32)
Calcium: 9.4 mg/dL (ref 8.4–10.5)
Chloride: 102 mEq/L (ref 96–112)
Creatinine, Ser: 0.74 mg/dL (ref 0.40–1.20)
GFR: 85.49 mL/min (ref >60.00)
Glucose, Bld: 88 mg/dL (ref 70–99)
Potassium: 4.1 mEq/L (ref 3.5–5.1)
Sodium: 139 mEq/L (ref 135–145)
Total Bilirubin: 0.6 mg/dL (ref 0.2–1.2)
Total Protein: 7.1 g/dL (ref 6.0–8.3)

## 2022-02-02 LAB — LIPID PANEL
Cholesterol: 235 mg/dL — ABNORMAL HIGH (ref 0–200)
HDL: 94 mg/dL (ref >39.00)
LDL Cholesterol: 131 mg/dL — ABNORMAL HIGH (ref 0–99)
NonHDL: 141.39
Total CHOL/HDL Ratio: 3
Triglycerides: 51 mg/dL (ref 0.0–149.0)
VLDL: 10.2 mg/dL (ref 0.0–40.0)

## 2022-02-02 LAB — VITAMIN D 25 HYDROXY (VIT D DEFICIENCY, FRACTURES): VITD: 63.98 ng/mL (ref 30.00–100.00)

## 2022-02-02 LAB — TSH: TSH: 2.22 u[IU]/mL (ref 0.35–5.50)

## 2022-02-02 NOTE — Assessment & Plan Note (Signed)
Acute ?Has soreness in her upper thigh regions that improves with stretching ?It does not bother her with exercise, but tends to bother her after sitting or laying for a period of time ?Likely muscular tightness ?She will make an effort to stretch more and see if that helps ?If this continues and consider referral to sports medicine for further evaluation ?

## 2022-02-02 NOTE — Assessment & Plan Note (Signed)
Chronic ?Mild ?Not on any medication and does not feel like she needs any medication ?

## 2022-02-02 NOTE — Assessment & Plan Note (Signed)
Chronic ?DEXA up-to-date ?She is exercising regularly ?She is taking vitamin D daily and calcium ?Check vitamin D level ?Would like to avoid medications at this time-recheck DEXA in a couple of years ?

## 2022-02-02 NOTE — Assessment & Plan Note (Signed)
Rash ?No EKG on file-we will get EKG today ?EKG shows NSR at 73 bpm, normal EKG.  No prior EKG for comparison ?Labs today including TSH, CBC, CMP ?

## 2022-06-15 ENCOUNTER — Other Ambulatory Visit: Payer: Self-pay | Admitting: Nurse Practitioner

## 2022-06-15 DIAGNOSIS — M810 Age-related osteoporosis without current pathological fracture: Secondary | ICD-10-CM

## 2022-06-19 ENCOUNTER — Other Ambulatory Visit: Payer: Self-pay | Admitting: Nurse Practitioner

## 2022-06-19 DIAGNOSIS — Z1231 Encounter for screening mammogram for malignant neoplasm of breast: Secondary | ICD-10-CM

## 2022-08-10 ENCOUNTER — Ambulatory Visit
Admission: RE | Admit: 2022-08-10 | Discharge: 2022-08-10 | Disposition: A | Discharge status: Home / Self Care | Payer: 59 | Source: Ambulatory Visit | Attending: Nurse Practitioner | Admitting: Nurse Practitioner

## 2022-08-10 DIAGNOSIS — Z1231 Encounter for screening mammogram for malignant neoplasm of breast: Secondary | ICD-10-CM
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
## 2022-08-28 ENCOUNTER — Ambulatory Visit (INDEPENDENT_AMBULATORY_CARE_PROVIDER_SITE_OTHER): Payer: 59 | Admitting: Sports Medicine

## 2022-08-28 ENCOUNTER — Ambulatory Visit (INDEPENDENT_AMBULATORY_CARE_PROVIDER_SITE_OTHER): Payer: 59

## 2022-08-28 VITALS — BP 118/80 | HR 81 | Ht 62.0 in | Wt 138.0 lb

## 2022-08-28 DIAGNOSIS — M87051 Idiopathic aseptic necrosis of right femur: Secondary | ICD-10-CM | POA: Diagnosis not present

## 2022-08-28 DIAGNOSIS — M25551 Pain in right hip: Secondary | ICD-10-CM

## 2022-08-28 DIAGNOSIS — M1611 Unilateral primary osteoarthritis, right hip: Secondary | ICD-10-CM | POA: Diagnosis not present

## 2022-08-28 DIAGNOSIS — M810 Age-related osteoporosis without current pathological fracture: Secondary | ICD-10-CM

## 2022-08-28 NOTE — Patient Instructions (Addendum)
Good to see you  Hip HEP  MRI referral  Follow up 3 days after to discuss your MRI

## 2022-09-07 ENCOUNTER — Ambulatory Visit (INDEPENDENT_AMBULATORY_CARE_PROVIDER_SITE_OTHER): Payer: 59

## 2022-09-07 DIAGNOSIS — M25551 Pain in right hip: Secondary | ICD-10-CM

## 2022-09-07 DIAGNOSIS — M87051 Idiopathic aseptic necrosis of right femur: Secondary | ICD-10-CM

## 2022-09-07 DIAGNOSIS — M810 Age-related osteoporosis without current pathological fracture: Secondary | ICD-10-CM

## 2022-09-07 DIAGNOSIS — M1611 Unilateral primary osteoarthritis, right hip: Secondary | ICD-10-CM
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
## 2022-09-09 ENCOUNTER — Ambulatory Visit (INDEPENDENT_AMBULATORY_CARE_PROVIDER_SITE_OTHER): Payer: 59 | Admitting: Sports Medicine

## 2022-09-09 VITALS — BP 110/78 | HR 76 | Ht 62.0 in | Wt 138.0 lb

## 2022-09-09 DIAGNOSIS — M1611 Unilateral primary osteoarthritis, right hip: Secondary | ICD-10-CM

## 2022-09-09 DIAGNOSIS — M25551 Pain in right hip: Secondary | ICD-10-CM

## 2022-09-09 DIAGNOSIS — S73191A Other sprain of right hip, initial encounter: Secondary | ICD-10-CM | POA: Diagnosis not present

## 2022-09-09 MED ORDER — MELOXICAM 15 MG PO TABS: 15.0000 mg | ORAL_TABLET | Freq: Every day | ORAL | 0 refills | Status: DC

## 2022-09-09 NOTE — Patient Instructions (Addendum)
Good to see you - Start meloxicam 15 mg daily x2 weeks.  If still having pain after 2 weeks, complete 3rd-week of meloxicam. May use remaining meloxicam as needed once daily for pain control.  Do not to use additional NSAIDs while taking meloxicam.  May use Tylenol 500-1000 mg 2 to 3 times a day for breakthrough pain. Continue HEP 3-4 week follow up  
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
## 2022-10-21 ENCOUNTER — Ambulatory Visit (INDEPENDENT_AMBULATORY_CARE_PROVIDER_SITE_OTHER): Payer: 59 | Admitting: Sports Medicine

## 2022-10-21 VITALS — BP 124/82 | HR 83 | Ht 62.0 in | Wt 137.0 lb

## 2022-10-21 DIAGNOSIS — M1611 Unilateral primary osteoarthritis, right hip: Secondary | ICD-10-CM

## 2022-10-21 DIAGNOSIS — M25551 Pain in right hip: Secondary | ICD-10-CM | POA: Diagnosis not present

## 2022-10-21 DIAGNOSIS — S73191D Other sprain of right hip, subsequent encounter: Secondary | ICD-10-CM | POA: Diagnosis not present

## 2022-10-21 NOTE — Patient Instructions (Signed)
Pt referral  Ortho referral  6 week follow up

## 2022-11-03 ENCOUNTER — Encounter: Payer: Self-pay | Admitting: Sports Medicine

## 2022-11-03 NOTE — Telephone Encounter (Signed)
Messaged via mychart and provided phone numbers

## 2022-11-12 NOTE — Therapy (Signed)
OUTPATIENT PHYSICAL THERAPY EVALUATION   Patient Name: Allison Kline MRN: [id number] DOB:Feb 07, 1956, 67 y.o., female Today's Date: 11/13/2022   END OF SESSION:  PT End of Session - 11/13/22 1025     Visit Number 1    Number of Visits 13    Date for PT Re-Evaluation 12/25/22    Authorization Type [employer]    PT Start Time 0037    PT Stop Time 1100    PT Time Calculation (min) 45 min    Activity Tolerance Patient tolerated treatment well    Behavior During Therapy [facility] for tasks assessed/performed             Past Medical History:  Diagnosis Date   Cataract    Hearing loss    Past Surgical History:  Procedure Laterality Date   BREAST BIOPSY Right 2015   STAPEDECTOMY Bilateral    Patient Active Problem List   Diagnosis Date Noted   Palpitations 02/02/2022   Muscle soreness 02/02/2022   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   Hearing loss 01/27/2021   Osteoporosis 01/27/2021   Anxiety 01/01/2021   Atrophic vaginitis 01/01/2021   Sleep disturbance 01/01/2021   Acute lateral meniscal tear, left, initial encounter 01/01/2021    PCP: Binnie Rail, MD  REFERRING PROVIDER: Glennon Mac, DO  REFERRING DIAG: Right hip pain; Primary osteoarthritis of right hip; Tear of right acetabular labrum, subsequent encounter   THERAPY DIAG:  Pain in right hip  Muscle weakness (generalized)  Other abnormalities of gait and mobility  Rationale for Evaluation and Treatment: Rehabilitation  ONSET DATE: ongoing > 1 year   SUBJECTIVE:  SUBJECTIVE STATEMENT: Patient reports right hip pain for over a year now. She initially thought it was hip flexor or ITB but had an x-ray and found out it was arthritis. MRI confirmed the bone on bone. She stands all day at work and these days she will have more pain. She also has more achiness as night and will colder or rainy weather. She has times where her her hip will feel like it  will give out on her. She feels like she favors the right and this causes knee pain and she feels uneven. She also reports she is not able to sleep on the right hip.   PERTINENT HISTORY: Osteoporosis  PAIN:  Are you having pain? Yes:  NPRS scale: 2/10 Pain location: Right hip Pain description: Aching,  Aggravating factors: Standing, walking, bending, lying on right side Relieving factors: Rest  PRECAUTIONS: None  WEIGHT BEARING RESTRICTIONS: No  FALLS:  Has patient fallen in last 6 months? No  OCCUPATION: Pharmacist so stands all day  PLOF: Independent  PATIENT GOALS: Improve hip pain and strength   OBJECTIVE:  DIAGNOSTIC FINDINGS:   Hip X-ray 08/29/2023 There is no evidence of hip fracture or dislocation. Severe, asymmetric superior joint space narrowing and femoroacetabular osteophytosis of the right hip. Left hip is relatively preserved, seen in frontal view only.  Hip MRI 09/08/2023 Advanced right hip osteoarthritis with associated degenerative tearing of the right labrum. Much milder appearing left hip osteoarthritis also noted.  PATIENT SURVEYS:  FOTO 60% functional status  COGNITION: Overall cognitive status: Within functional limits for tasks assessed     SENSATION: WFL  MUSCLE LENGTH: Grossly WFL  POSTURE:   WFL  PALPATION: Mildly tender to palpation right gluteal region and greater trochanter  LOWER EXTREMITY ROM: Patient demonstrates limitations in all ranges of right hip, primarily hip  flexion to approximately 90 deg and IR to neutral on right with PROM  LOWER EXTREMITY MMT:  MMT Right eval Left eval  Hip flexion 4 5  Hip extension 4- 4  Hip abduction 4- 4  Hip adduction    Hip internal rotation    Hip external rotation    Knee flexion 4+ 5  Knee extension 4+ 5  Ankle dorsiflexion    Ankle plantarflexion    Ankle inversion    Ankle eversion     (Blank rows = not tested)  FUNCTIONAL TESTS:  SLS: trendelenburg on  right  GAIT: Assistive device utilized: None Level of assistance: Complete Independence Comments: Trendelenburg on right   TODAY'S TREATMENT:        [facility] Adult PT Treatment:                                                DATE: 11/13/2022 Therapeutic Exercise: Figure-4 bridge x 10 Clamshell with red x 10  PATIENT EDUCATION:  Education details: Exam findings, POC, HEP Person educated: Patient Education method: Explanation, Demonstration, Tactile cues, Verbal cues, and Handouts Education comprehension: verbalized understanding, returned demonstration, verbal cues required, tactile cues required, and needs further education  HOME EXERCISE PROGRAM: Access Code: [facility]    ASSESSMENT: CLINICAL IMPRESSION: Patient is a 67 y.o. female who was seen today for physical therapy evaluation and treatment for chronic right hip pain. She demonstrates limitations primarily in hip flexion and IR that are consistent with arthritis, generalized right hip strength deficits and trendelenburg gait consistent with hip weakness likely leading to pain and a decreased activity tolerance with activities such as standing and walking at work.   OBJECTIVE IMPAIRMENTS: Abnormal gait, decreased activity tolerance, decreased ROM, decreased strength, and pain.   ACTIVITY LIMITATIONS: standing, squatting, sleeping, and locomotion level  PARTICIPATION LIMITATIONS: community activity and occupation  PERSONAL FACTORS: Time since onset of injury/illness/exacerbation are also affecting patient's functional outcome.   REHAB POTENTIAL: Good  CLINICAL DECISION MAKING: Stable/uncomplicated  EVALUATION COMPLEXITY: Low   GOALS: Goals reviewed with patient? Yes  SHORT TERM GOALS: Target date: 12/04/2022  Patient will be I with initial HEP in order to progress with therapy. Baseline: HEP provided at eval Goal status: INITIAL  2.  PT will review FOTO with patient by 3rd visit in order to understand expected  progress and outcome with therapy. Baseline: FOTO assessed at eval Goal status: INITIAL  LONG TERM GOALS: Target date: 12/25/2022  Patient will be I with final HEP to maintain progress from PT. Baseline: HEP provided at eval Goal status: INITIAL  2.  Patient will report >/= 70% status on FOTO to indicate improved functional ability. Baseline: 60% functional Goal status: INITIAL  3.  Patient will demonstrate right hip strength >/= 4/5 MMT in order to improve tolerance for standing and walking tasks Baseline: grossly 4-/5 MMT Goal status: INITIAL  4.  Patient will report 50% improvement in pain with work related standing and walking tasks in order to reduce functional limitations Baseline: patient reports moderate to severe pain with work related tasks Goal status: INITIAL   PLAN: PT FREQUENCY: 1-2x/week  PT DURATION: 6 weeks  PLANNED INTERVENTIONS: Therapeutic exercises, Therapeutic activity, Neuromuscular re-education, Balance training, Gait training, Patient/Family education, Self Care, Joint mobilization, Joint manipulation, Aquatic Therapy, Dry Needling, Spinal manipulation, Spinal mobilization, Ionotophoresis 4mg /ml Dexamethasone, Manual therapy, and Re-evaluation  PLAN FOR NEXT SESSION: Review HEP and progress PRN, manual/mobs with strap for right hip, progressive right glute and core strengthening   Rosana Hoes, PT, DPT, LAT, ATC 11/13/22  11:29 AM Phone: [phone] Fax: [phone]

## 2022-11-13 ENCOUNTER — Other Ambulatory Visit: Payer: Self-pay

## 2022-11-13 ENCOUNTER — Ambulatory Visit: Payer: 59 | Attending: Internal Medicine | Admitting: Physical Therapy

## 2022-11-13 ENCOUNTER — Encounter: Payer: Self-pay | Admitting: Physical Therapy

## 2022-11-13 DIAGNOSIS — R2689 Other abnormalities of gait and mobility: Secondary | ICD-10-CM | POA: Insufficient documentation

## 2022-11-13 DIAGNOSIS — M25551 Pain in right hip: Secondary | ICD-10-CM | POA: Diagnosis not present

## 2022-11-13 DIAGNOSIS — M1611 Unilateral primary osteoarthritis, right hip: Secondary | ICD-10-CM | POA: Diagnosis not present

## 2022-11-13 DIAGNOSIS — M6281 Muscle weakness (generalized): Secondary | ICD-10-CM | POA: Diagnosis present

## 2022-11-13 DIAGNOSIS — S73191D Other sprain of right hip, subsequent encounter: Secondary | ICD-10-CM | POA: Diagnosis not present

## 2022-11-13 NOTE — Patient Instructions (Signed)
Access Code: [facility] URL: [email or web address] Date: 11/13/2022 Prepared by: Hilda Blades  Exercises - Figure 4 Bridge  - 2-3 sets - 10 reps - Clam with Resistance  - 2-3 sets - 10 reps

## 2022-11-25 ENCOUNTER — Other Ambulatory Visit: Payer: 59

## 2022-11-26 NOTE — Therapy (Signed)
OUTPATIENT PHYSICAL THERAPY TREATMENT NOTE   Patient Name: Allison Kline MRN: [id number] DOB:06-04-1956, 67 y.o., female Today's Date: 11/27/2022  PCP: Binnie Rail, MD REFERRING PROVIDER: Glennon Mac DO  END OF SESSION:   PT End of Session - 11/27/22 1014     Visit Number 2    Number of Visits 13    Date for PT Re-Evaluation 12/25/22    Authorization Type [employer]    PT Start Time [id number]    PT Stop Time 1056    PT Time Calculation (min) 41 min    Activity Tolerance Patient tolerated treatment well    Behavior During Therapy [facility] for tasks assessed/performed             Past Medical History:  Diagnosis Date   Cataract    Hearing loss    Past Surgical History:  Procedure Laterality Date   BREAST BIOPSY Right 2015   STAPEDECTOMY Bilateral    Patient Active Problem List   Diagnosis Date Noted   Palpitations 02/02/2022   Muscle soreness 02/02/2022   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   Hearing loss 01/27/2021   Osteoporosis 01/27/2021   Anxiety 01/01/2021   Atrophic vaginitis 01/01/2021   Sleep disturbance 01/01/2021   Acute lateral meniscal tear, left, initial encounter 01/01/2021    REFERRING DIAG: Right hip pain; Primary osteoarthritis of right hip; Tear of right acetabular labrum, subsequent encounter   THERAPY DIAG:  Pain in right hip  Muscle weakness (generalized)  Other abnormalities of gait and mobility  Rationale for Evaluation and Treatment Rehabilitation  PERTINENT HISTORY: osteoporosis   PRECAUTIONS: none   SUBJECTIVE:                                                                                                                                                                                      SUBJECTIVE STATEMENT:  Patient reports the hip is sore. She has been working on her exercises, but the figure 4 bridge is uncomfortable.She saw a surgeon this week and has plans to  undergo a hip replacement.    PAIN:  Are you having pain? Yes: NPRS scale: 3/10 Pain location: Rt anterior hip Pain description: dull Aggravating factors: sitting, transitioning from sit to stand Relieving factors: movement   OBJECTIVE: (objective measures completed at initial evaluation unless otherwise dated) DIAGNOSTIC FINDINGS:             Hip X-ray 08/29/2023 There is no evidence of hip fracture or dislocation. Severe, asymmetric superior joint space narrowing and femoroacetabular osteophytosis of the right hip. Left hip is relatively preserved, seen in frontal view only.  Hip MRI 09/08/2023 Advanced right hip osteoarthritis with associated degenerative tearing of the right labrum. Much milder appearing left hip osteoarthritis also noted.   PATIENT SURVEYS:  FOTO 60% functional status   COGNITION: Overall cognitive status: Within functional limits for tasks assessed                         SENSATION: WFL   MUSCLE LENGTH: Grossly WFL   POSTURE:             WFL   PALPATION: Mildly tender to palpation right gluteal region and greater trochanter   LOWER EXTREMITY ROM: Patient demonstrates limitations in all ranges of right hip, primarily hip flexion to approximately 90 deg and IR to neutral on right with PROM   LOWER EXTREMITY MMT:   MMT Right eval Left eval 11/27/22 R/L   Hip flexion 4 5   Hip extension 4- 4 4-/4+  Hip abduction 4- 4   Hip adduction       Hip internal rotation       Hip external rotation       Knee flexion 4+ 5   Knee extension 4+ 5   Ankle dorsiflexion       Ankle plantarflexion       Ankle inversion       Ankle eversion        (Blank rows = not tested)   FUNCTIONAL TESTS:  SLS: trendelenburg on right   GAIT: Assistive device utilized: None Level of assistance: Complete Independence Comments: Trendelenburg on right     TODAY'S TREATMENT:        [facility] Adult PT Treatment:                                                DATE:  11/27/22 Therapeutic Exercise: Supine hip flexor stretch  Hip bridge with resisted hip abduction 2 x 10; blue band  Hip bridge with knee extension 2 x 10  Sidelying hip abduction 2 x 10  Supine TA march 2 x 10  Prone hip extension 2 x 10  Updated HEP Manual Therapy: Rt hip mobilizations inferior, posterior grade II-III RLE LAD 2 x 1 minute STM Rt hip flexor    [facility] Adult PT Treatment:                                                DATE: 11/13/2022 Therapeutic Exercise: Figure-4 bridge x 10 Clamshell with red x 10   PATIENT EDUCATION:  Education details: , HEP Person educated: Patient Education method: Explanation, Demonstration, Tactile cues, Verbal cues, and Handouts Education comprehension: verbalized understanding, returned demonstration, verbal cues required, tactile cues required, and needs further education   HOME EXERCISE PROGRAM: Access Code: [facility]      ASSESSMENT: CLINICAL IMPRESSION: Patient arrives with mild hip pain. Since initial evaluation she saw orthopedic surgeon and has plans to schedule a THA in the near future. Today's session focused on hip and core strengthening with good tolerance. HEP was modified/updated as patient reported that figure 4 bridging caused more anterior hip pain.     OBJECTIVE IMPAIRMENTS: Abnormal gait, decreased activity tolerance, decreased ROM, decreased strength, and pain.    ACTIVITY LIMITATIONS: standing,  squatting, sleeping, and locomotion level   PARTICIPATION LIMITATIONS: community activity and occupation   PERSONAL FACTORS: Time since onset of injury/illness/exacerbation are also affecting patient's functional outcome.    REHAB POTENTIAL: Good   CLINICAL DECISION MAKING: Stable/uncomplicated   EVALUATION COMPLEXITY: Low     GOALS: Goals reviewed with patient? Yes   SHORT TERM GOALS: Target date: 12/04/2022   Patient will be I with initial HEP in order to progress with therapy. Baseline: HEP provided at eval Goal  status: INITIAL   2.  PT will review FOTO with patient by 3rd visit in order to understand expected progress and outcome with therapy. Baseline: FOTO assessed at eval Goal status: INITIAL   LONG TERM GOALS: Target date: 12/25/2022   Patient will be I with final HEP to maintain progress from PT. Baseline: HEP provided at eval Goal status: INITIAL   2.  Patient will report >/= 70% status on FOTO to indicate improved functional ability. Baseline: 60% functional Goal status: INITIAL   3.  Patient will demonstrate right hip strength >/= 4/5 MMT in order to improve tolerance for standing and walking tasks Baseline: grossly 4-/5 MMT Goal status: INITIAL   4.  Patient will report 50% improvement in pain with work related standing and walking tasks in order to reduce functional limitations Baseline: patient reports moderate to severe pain with work related tasks Goal status: INITIAL     PLAN: PT FREQUENCY: 1-2x/week   PT DURATION: 6 weeks   PLANNED INTERVENTIONS: Therapeutic exercises, Therapeutic activity, Neuromuscular re-education, Balance training, Gait training, Patient/Family education, Self Care, Joint mobilization, Joint manipulation, Aquatic Therapy, Dry Needling, Spinal manipulation, Spinal mobilization, Ionotophoresis 66m/ml Dexamethasone, Manual therapy, and Re-evaluation   PLAN FOR NEXT SESSION: Review HEP and progress PRN, manual/mobs with strap for right hip, progressive right glute and core strengthening      SGwendolyn Grant PT, DPT, ATC 11/27/22 10:56 AM

## 2022-11-27 ENCOUNTER — Ambulatory Visit: Payer: 59 | Attending: Internal Medicine

## 2022-11-27 ENCOUNTER — Ambulatory Visit
Admission: RE | Admit: 2022-11-27 | Discharge: 2022-11-27 | Disposition: A | Discharge status: Home / Self Care | Payer: 59 | Source: Ambulatory Visit | Attending: Nurse Practitioner | Admitting: Nurse Practitioner

## 2022-11-27 DIAGNOSIS — M810 Age-related osteoporosis without current pathological fracture: Secondary | ICD-10-CM

## 2022-11-27 DIAGNOSIS — M6281 Muscle weakness (generalized): Secondary | ICD-10-CM

## 2022-11-27 DIAGNOSIS — M25551 Pain in right hip: Secondary | ICD-10-CM | POA: Insufficient documentation

## 2022-11-27 DIAGNOSIS — R2689 Other abnormalities of gait and mobility: Secondary | ICD-10-CM

## 2022-11-30 ENCOUNTER — Telehealth: Payer: Self-pay | Admitting: Internal Medicine

## 2022-11-30 NOTE — Telephone Encounter (Signed)
Appointment made for this Wednesday.

## 2022-11-30 NOTE — Telephone Encounter (Signed)
For our records:  We have received Pre-OP PW for the pt and it has been placed in Dr. Quay Burow boxes.   Upon completion please fax to: [phone]

## 2022-12-01 ENCOUNTER — Encounter: Payer: Self-pay | Admitting: Internal Medicine

## 2022-12-01 NOTE — Patient Instructions (Signed)
    We will send your pre-op form to Dr Rhona Raider.

## 2022-12-01 NOTE — Progress Notes (Unsigned)
    Subjective:    Patient ID: Allison Kline, female    DOB: [birth year], 67 y.o.   MRN: [id number]      HPI Allison Kline is here for pre-operative clearance at the request of Dr Lannette Donath for XXX scheduled for XXX.   Allison Kline denies any personal or family history of problems with anesthesia or bleeding/blood clot problems.    Allison Kline has no concerns and is taking all prescribed medication as prescribed.   Allison Kline is exercising regularly.  With their daily activities they denies chest pain, palpitations, SOB and lightheadedness.        Medications and allergies reviewed with patient and updated if appropriate.  Current Outpatient Medications on File Prior to Visit  Medication Sig Dispense Refill   Cholecalciferol (VITAMIN D3) 125 MCG (5000 UT) CAPS Take 1 capsule (5,000 Units total) by mouth daily. 30 capsule    FLORICAL 8.3-364 MG CAPS capsule TAKE 1 CAPSULE BY MOUTH 2 TIMES DAILY WITH AS VITAMIN d daily  3   meloxicam (MOBIC) 15 MG tablet Take 1 tablet (15 mg total) by mouth daily. 30 tablet 0   No current facility-administered medications on file prior to visit.    Review of Systems     Objective:  There were no vitals filed for this visit. There were no vitals filed for this visit. There is no height or weight on file to calculate BMI.  BP Readings from Last 3 Encounters:  10/21/22 124/82  09/09/22 110/78  08/28/22 118/80    Wt Readings from Last 3 Encounters:  10/21/22 137 lb (62.1 kg)  09/09/22 138 lb (62.6 kg)  08/28/22 138 lb (62.6 kg)       Physical Exam Constitutional: She appears well-developed and well-nourished. No distress.  HENT:  Head: Normocephalic and atraumatic.  Right Ear: External ear normal. Normal ear canal and TM Left Ear: External ear normal.  Normal ear canal and TM Mouth/Throat: Oropharynx is clear and moist.  Eyes: Conjunctivae normal.  Neck: Neck supple. No tracheal deviation present. No thyromegaly present.  No carotid bruit  Cardiovascular:  Normal rate, regular rhythm and normal heart sounds.  No murmur heard.  No edema. Pulmonary/Chest: Effort normal and breath sounds normal. No respiratory distress. She has no wheezes. She has no rales.   Abdominal: Soft. She exhibits no distension. There is no tenderness.  Lymphadenopathy: She has no cervical adenopathy.  Skin: Skin is warm and dry. She is not diaphoretic.  Psychiatric: She has a normal mood and affect. Her behavior is normal.     Lab Results  Component Value Date   WBC 5.6 02/02/2022   HGB 12.8 02/02/2022   HCT 38.1 02/02/2022   PLT 281.0 02/02/2022   GLUCOSE 88 02/02/2022   CHOL 235 (H) 02/02/2022   TRIG 51.0 02/02/2022   HDL 94.00 02/02/2022   LDLCALC 131 (H) 02/02/2022   ALT 13 02/02/2022   AST 21 02/02/2022   NA 139 02/02/2022   K 4.1 02/02/2022   CL 102 02/02/2022   CREATININE 0.74 02/02/2022   BUN 20 02/02/2022   CO2 29 02/02/2022   TSH 2.22 02/02/2022         Assessment & Plan:     See Problem List for Assessment and Plan of chronic medical problems.

## 2022-12-02 ENCOUNTER — Ambulatory Visit (INDEPENDENT_AMBULATORY_CARE_PROVIDER_SITE_OTHER): Payer: 59 | Admitting: Internal Medicine

## 2022-12-02 ENCOUNTER — Encounter: Payer: Self-pay | Admitting: Internal Medicine

## 2022-12-02 VITALS — BP 120/78 | HR 83 | Temp 98.6°F | Ht 62.0 in | Wt 136.0 lb

## 2022-12-02 DIAGNOSIS — M1611 Unilateral primary osteoarthritis, right hip: Secondary | ICD-10-CM

## 2022-12-02 DIAGNOSIS — M810 Age-related osteoporosis without current pathological fracture: Secondary | ICD-10-CM

## 2022-12-02 DIAGNOSIS — Z01818 Encounter for other preprocedural examination: Secondary | ICD-10-CM | POA: Diagnosis not present

## 2022-12-02 NOTE — Assessment & Plan Note (Signed)
Here for preop evaluation at the request of Dr. Rhona Raider for right total hip arthroplasty with spinal anesthesia-date to be determined She is very healthy and has no history or symptoms consistent with cardiovascular disease or respiratory disease Vital signs stable Currently only taking supplements Medically stable and low risk for surgery She will be getting basic blood work at her preop visit EKG last year was normal and no concerning cardiac symptoms or history so no need to repeat today  Low risk for surgery-paperwork faxed to Dr. Rhona Raider office

## 2022-12-02 NOTE — Assessment & Plan Note (Signed)
Severe osteoarthritis of the right hip Has been having some mild symptoms for a while-found out that it was severe in nature and will be having a hip replacement likely next month

## 2022-12-02 NOTE — Assessment & Plan Note (Signed)
Osteoporosis of left hip Recent DEXA Taking vitamin D daily She is taking some calcium-advised increasing her calcium intake with supplements and also eating a high calcium diet She does take magnesium She is exercising regularly GYN referred her to endocrine-encouraged her to see them for further evaluation to make sure there was not any other cause for the osteoporosis Does have a family history-mother Briefly discussed medication-she is aware of them

## 2022-12-03 NOTE — Therapy (Signed)
OUTPATIENT PHYSICAL THERAPY TREATMENT NOTE   Patient Name: Allison Kline MRN: [id number] DOB:1956-09-14, 67 y.o., female Today's Date: 12/04/2022  PCP: Binnie Rail, MD REFERRING PROVIDER: Glennon Mac DO   END OF SESSION:   PT End of Session - 12/04/22 1022     Visit Number 3    Number of Visits 13    Date for PT Re-Evaluation 12/25/22    Authorization Type [employer]    PT Start Time [id number]    PT Stop Time 1100    PT Time Calculation (min) 45 min    Activity Tolerance Patient tolerated treatment well    Behavior During Therapy WFL for tasks assessed/performed              Past Medical History:  Diagnosis Date   Cataract    Hearing loss    Past Surgical History:  Procedure Laterality Date   BREAST BIOPSY Right 2015   STAPEDECTOMY Bilateral    Patient Active Problem List   Diagnosis Date Noted   Preop examination 12/02/2022   Osteoarthritis of right hip 12/02/2022   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  PAIN:  Are you having pain? Yes:  NPRS scale: 5/10 Pain location: Rt anterior hip Pain description: dull Aggravating factors: sitting, transitioning from sit to stand Relieving factors: movement   OBJECTIVE: (objective measures completed at initial evaluation unless otherwise dated) DIAGNOSTIC FINDINGS:              Hip X-ray 08/29/2023 There is no evidence of hip fracture or dislocation. Severe, asymmetric superior joint space narrowing and femoroacetabular osteophytosis of the right hip. Left hip is relatively preserved, seen in frontal view only.   Hip MRI 09/08/2023 Advanced right hip osteoarthritis with associated degenerative tearing of the right labrum. Much milder appearing left hip osteoarthritis also noted.   PATIENT SURVEYS:  FOTO 60% functional status   PALPATION: Mildly tender to palpation right gluteal region and greater trochanter   LOWER EXTREMITY ROM: Patient demonstrates limitations in all ranges of right hip, primarily hip flexion to approximately 90 deg and IR to neutral on right with PROM   LOWER EXTREMITY MMT:   MMT Right eval Left eval 11/27/22 R/L  Right 12/04/22  Hip flexion 4 5    Hip extension 4- 4 4-/4+ 4-  Hip abduction 4- 4  4-  Hip adduction        Hip internal rotation        Hip external rotation        Knee flexion 4+ 5    Knee extension 4+ 5    Ankle dorsiflexion        Ankle plantarflexion        Ankle inversion        Ankle eversion         (Blank rows = not tested)  FUNCTIONAL TESTS:  SLS: trendelenburg on right   GAIT: Assistive device utilized: None Level of assistance: Complete Independence Comments: Trendelenburg on right     TODAY'S TREATMENT:        [facility] Adult PT Treatment:                                                DATE: 12/04/22 Therapeutic Exercise: Recumbent bike L2 x 5 min while taking subjective Modified thomas stretch x 60 Piriformis push / pull x 30 sec each Bridge with ball squeeze 2 x 10 TA activation with SLR 2 x 10 Sidelying hip abduction x 10 Side clamshell with red x 15 Quadruped hydrant with red x 10 Quadruped donkey kick with red x 10 Deadlift 25# from 6" box 2 x 10 Manual Therapy: LAD right LE x 3 bouts Hooklying lateral hip mobs with belt x 3 bouts   [facility] Adult PT Treatment:                                                 DATE: 11/27/22 Therapeutic Exercise: Supine hip flexor stretch  Hip bridge with resisted hip abduction 2 x 10; blue band  Hip bridge with knee extension 2 x 10  Sidelying hip abduction 2 x 10  Supine TA march 2 x 10  Prone hip extension 2 x 10  Updated HEP Manual Therapy: Rt hip mobilizations inferior, posterior grade II-III RLE LAD 2 x 1 minute STM Rt hip flexor  [facility] Adult PT Treatment:                                                DATE: 11/13/2022 Therapeutic Exercise: Figure-4 bridge x 10 Clamshell with red x 10   PATIENT EDUCATION:  Education details: HEP Person educated: Patient Education method: Consulting civil engineer, Demonstration, Corporate treasurer cues, Verbal cues Education comprehension: verbalized understanding, returned demonstration, verbal cues required, tactile cues required, and needs further education   HOME EXERCISE PROGRAM: Access Code: [email or web address]      ASSESSMENT: CLINICAL IMPRESSION: Patient tolerated therapy well with no adverse effects. Therapy focused on progressing right hip mobility and strengthening with good tolerance. She does exhibit muscular fatigue with exercises and reported some anterior hip discomfort with deadlifts at bottom of lift. She continues to exhibit gross right hip strength deficit and requires cueing for pelvic control and proper technique with exercises. No changes to HEP this visit. Patient would benefit from continued skilled PT to progress her mobility and strength in order to reduce pain and prepared for hip replacement surgery.    OBJECTIVE IMPAIRMENTS: Abnormal gait, decreased activity tolerance, decreased ROM, decreased strength, and pain.    ACTIVITY LIMITATIONS: standing, squatting, sleeping, and locomotion level   PARTICIPATION LIMITATIONS: community activity and occupation   PERSONAL FACTORS: Time since onset of injury/illness/exacerbation are also affecting patient's functional outcome.      GOALS: Goals reviewed with  patient? Yes   SHORT TERM GOALS: Target date: 12/04/2022   Patient will be I with initial HEP in order to progress with therapy. Baseline: HEP provided at eval 12/04/2022: progressing Goal  status: MET   2.  PT will review FOTO with patient by 3rd visit in order to understand expected progress and outcome with therapy. Baseline: FOTO assessed at eval 12/04/2022: reviewed Goal status: MET   LONG TERM GOALS: Target date: 12/25/2022   Patient will be I with final HEP to maintain progress from PT. Baseline: HEP provided at eval Goal status: INITIAL   2.  Patient will report >/= 70% status on FOTO to indicate improved functional ability. Baseline: 60% functional Goal status: INITIAL   3.  Patient will demonstrate right hip strength >/= 4/5 MMT in order to improve tolerance for standing and walking tasks Baseline: grossly 4-/5 MMT Goal status: INITIAL   4.  Patient will report 50% improvement in pain with work related standing and walking tasks in order to reduce functional limitations Baseline: patient reports moderate to severe pain with work related tasks Goal status: INITIAL     PLAN: PT FREQUENCY: 1-2x/week   PT DURATION: 6 weeks   PLANNED INTERVENTIONS: Therapeutic exercises, Therapeutic activity, Neuromuscular re-education, Balance training, Gait training, Patient/Family education, Self Care, Joint mobilization, Joint manipulation, Aquatic Therapy, Dry Needling, Spinal manipulation, Spinal mobilization, Ionotophoresis 69m/ml Dexamethasone, Manual therapy, and Re-evaluation   PLAN FOR NEXT SESSION: Review HEP and progress PRN, manual/mobs with strap for right hip, progressive right glute and core strengthening      CHilda Blades PT, DPT, LAT, ATC 12/04/22  11:13 AM Phone: [phone]: [phone]

## 2022-12-04 ENCOUNTER — Encounter: Payer: Self-pay | Admitting: Physical Therapy

## 2022-12-04 ENCOUNTER — Other Ambulatory Visit: Payer: Self-pay

## 2022-12-04 ENCOUNTER — Encounter: Payer: Self-pay | Admitting: Internal Medicine

## 2022-12-04 ENCOUNTER — Ambulatory Visit: Payer: 59 | Admitting: Physical Therapy

## 2022-12-04 DIAGNOSIS — M25551 Pain in right hip: Secondary | ICD-10-CM | POA: Diagnosis not present

## 2022-12-04 DIAGNOSIS — M6281 Muscle weakness (generalized): Secondary | ICD-10-CM

## 2022-12-04 DIAGNOSIS — R2689 Other abnormalities of gait and mobility: Secondary | ICD-10-CM

## 2022-12-07 ENCOUNTER — Ambulatory Visit: Payer: 59 | Admitting: Sports Medicine

## 2022-12-08 ENCOUNTER — Telehealth: Payer: Self-pay | Admitting: Internal Medicine

## 2022-12-08 NOTE — Telephone Encounter (Signed)
Received Preoperative Risk Assessment form from [facility]. Form was printed out and placed in Dr. Quay Burow box at the front.

## 2022-12-08 NOTE — Telephone Encounter (Signed)
[facility] has called and asked that the pts last office notes along with the last set of labs that was taken in the last 6 months. Please Fax the information to [phone] or [phone].

## 2022-12-08 NOTE — Telephone Encounter (Signed)
Faxed labs and last OV to Judy's attention today.

## 2022-12-08 NOTE — Telephone Encounter (Signed)
Faxed last OV and labs to [phone].Marland KitchenJohny Chess

## 2022-12-11 ENCOUNTER — Ambulatory Visit: Payer: 59

## 2022-12-11 DIAGNOSIS — M25551 Pain in right hip: Secondary | ICD-10-CM

## 2022-12-11 DIAGNOSIS — R2689 Other abnormalities of gait and mobility: Secondary | ICD-10-CM

## 2022-12-11 DIAGNOSIS — M6281 Muscle weakness (generalized): Secondary | ICD-10-CM

## 2022-12-11 NOTE — Therapy (Signed)
OUTPATIENT PHYSICAL THERAPY TREATMENT NOTE   Patient Name: Allison Kline MRN: [id number] DOB:Jun 28, 1956, 67 y.o., female Today's Date: 12/11/2022  PCP: Binnie Rail, MD REFERRING PROVIDER: Glennon Mac DO   END OF SESSION:   PT End of Session - 12/11/22 1011     Visit Number 4    Number of Visits 13    Date for PT Re-Evaluation 12/25/22    Authorization Type [employer]    PT Start Time 1016    PT Stop Time 1058    PT Time Calculation (min) 42 min    Activity Tolerance Patient tolerated treatment well    Behavior During Therapy WFL for tasks assessed/performed               Past Medical History:  Diagnosis Date   Cataract    Hearing loss    Past Surgical History:  Procedure Laterality Date   BREAST BIOPSY Right 2015   STAPEDECTOMY Bilateral    Patient Active Problem List   Diagnosis Date Noted   Preop examination 12/02/2022   Osteoarthritis of right hip 12/02/2022   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 She worked 3 days in a row and now the hip is tight and painful.   PAIN:  Are you having pain? Yes:  NPRS scale: 3/10 Pain location: Rt anterior hip Pain description: tight  Aggravating factors: sitting, transitioning from sit to stand Relieving factors: movement   OBJECTIVE: (objective measures completed at initial evaluation unless  otherwise dated) DIAGNOSTIC FINDINGS:             Hip X-ray 08/29/2023 There is no evidence of hip fracture or dislocation. Severe, asymmetric superior joint space narrowing and femoroacetabular osteophytosis of the right hip. Left hip is relatively preserved, seen in frontal view only.   Hip MRI 09/08/2023 Advanced right hip osteoarthritis with associated degenerative tearing of the right labrum. Much milder appearing left hip osteoarthritis also noted.   PATIENT SURVEYS:  FOTO 60% functional status   PALPATION: Mildly tender to palpation right gluteal region and greater trochanter   LOWER EXTREMITY ROM: Patient demonstrates limitations in all ranges of right hip, primarily hip flexion to approximately 90 deg and IR to neutral on right with PROM   LOWER EXTREMITY MMT:   MMT Right eval Left eval 11/27/22 R/L  Right 12/04/22  Hip flexion 4 5    Hip extension 4- 4 4-/4+ 4-  Hip abduction 4- 4  4-  Hip adduction        Hip internal rotation        Hip external rotation        Knee flexion 4+ 5    Knee extension 4+ 5    Ankle dorsiflexion        Ankle plantarflexion        Ankle inversion        Ankle eversion         (  Blank rows = not tested)   FUNCTIONAL TESTS:  SLS: trendelenburg on right   GAIT: Assistive device utilized: None Level of assistance: Complete Independence Comments: Trendelenburg on right     TODAY'S TREATMENT:        [facility] Adult PT Treatment:                                                DATE: 12/11/22 Therapeutic Exercise: Recumbent bike level 2 x 5 minutes  SL hip bridge 2 x 10  Sidelying leg taps 2 x 10  90/90 toe taps 2 x 10   Side plank 2 x 20 sec each  Bird dog 2 x 10  Updated HEP  Manual Therapy: LAD RLE x 3 bouts Rt hip mobilizations inferior and posterior grade II-III    [facility] Adult PT Treatment:                                                DATE: 12/04/22 Therapeutic Exercise: Recumbent bike L2 x 5 min while taking  subjective Modified thomas stretch x 60 Piriformis push / pull x 30 sec each Bridge with ball squeeze 2 x 10 TA activation with SLR 2 x 10 Sidelying hip abduction x 10 Side clamshell with red x 15 Quadruped hydrant with red x 10 Quadruped donkey kick with red x 10 Deadlift 25# from 6" box 2 x 10 Manual Therapy: LAD right LE x 3 bouts Hooklying lateral hip mobs with belt x 3 bouts   [facility] Adult PT Treatment:                                                DATE: 11/27/22 Therapeutic Exercise: Supine hip flexor stretch  Hip bridge with resisted hip abduction 2 x 10; blue band  Hip bridge with knee extension 2 x 10  Sidelying hip abduction 2 x 10  Supine TA march 2 x 10  Prone hip extension 2 x 10  Updated HEP Manual Therapy: Rt hip mobilizations inferior, posterior grade II-III RLE LAD 2 x 1 minute STM Rt hip flexor  PATIENT EDUCATION:  Education details: HEP Person educated: Patient Education method: Consulting civil engineer, Demonstration, Corporate treasurer cues, Verbal cues, handout  Education comprehension: verbalized understanding, returned demonstration, verbal cues required, tactile cues required, and needs further education   HOME EXERCISE PROGRAM: Access Code: [email or web address]      ASSESSMENT: CLINICAL IMPRESSION: Patient tolerated therapy well with no adverse effects. She has THA scheduled on 01/05/23. Continued with hip and core strengthening with good tolerance. She is challenged with side plank on the Rt as well as quadruped strengthening having mild difficulty maintaining lumbopelvic stability.  She has plans to bring in her RW at next session to get this appropriately fitted prior to her surgery. HEP was updated to include further strengthening.     OBJECTIVE IMPAIRMENTS: Abnormal gait, decreased activity tolerance, decreased ROM, decreased strength, and pain.    ACTIVITY LIMITATIONS: standing, squatting, sleeping, and locomotion level   PARTICIPATION LIMITATIONS: community activity and  occupation   PERSONAL FACTORS: Time since onset of injury/illness/exacerbation are also  affecting patient's functional outcome.      GOALS: Goals reviewed with patient? Yes   SHORT TERM GOALS: Target date: 12/04/2022   Patient will be I with initial HEP in order to progress with therapy. Baseline: HEP provided at eval 12/04/2022: progressing Goal status: MET   2.  PT will review FOTO with patient by 3rd visit in order to understand expected progress and outcome with therapy. Baseline: FOTO assessed at eval 12/04/2022: reviewed Goal status: MET   LONG TERM GOALS: Target date: 12/25/2022   Patient will be I with final HEP to maintain progress from PT. Baseline: HEP provided at eval Goal status: INITIAL   2.  Patient will report >/= 70% status on FOTO to indicate improved functional ability. Baseline: 60% functional Goal status: INITIAL   3.  Patient will demonstrate right hip strength >/= 4/5 MMT in order to improve tolerance for standing and walking tasks Baseline: grossly 4-/5 MMT Goal status: INITIAL   4.  Patient will report 50% improvement in pain with work related standing and walking tasks in order to reduce functional limitations Baseline: patient reports moderate to severe pain with work related tasks Goal status: INITIAL     PLAN: PT FREQUENCY: 1-2x/week   PT DURATION: 6 weeks   PLANNED INTERVENTIONS: Therapeutic exercises, Therapeutic activity, Neuromuscular re-education, Balance training, Gait training, Patient/Family education, Self Care, Joint mobilization, Joint manipulation, Aquatic Therapy, Dry Needling, Spinal manipulation, Spinal mobilization, Ionotophoresis '4mg'$ /ml Dexamethasone, Manual therapy, and Re-evaluation   PLAN FOR NEXT SESSION: Review HEP and progress PRN, manual/mobs with strap for right hip, progressive right glute and core strengthening; gait and stair training with RW for upcoming surgery.     Gwendolyn Grant, PT, DPT, ATC 12/11/22 10:58 AM

## 2022-12-14 ENCOUNTER — Ambulatory Visit: Payer: 59 | Admitting: Physical Therapy

## 2022-12-14 ENCOUNTER — Encounter: Payer: Self-pay | Admitting: Physical Therapy

## 2022-12-14 ENCOUNTER — Other Ambulatory Visit: Payer: Self-pay

## 2022-12-14 DIAGNOSIS — M25551 Pain in right hip: Secondary | ICD-10-CM

## 2022-12-14 DIAGNOSIS — R2689 Other abnormalities of gait and mobility: Secondary | ICD-10-CM

## 2022-12-14 DIAGNOSIS — M6281 Muscle weakness (generalized): Secondary | ICD-10-CM

## 2022-12-14 NOTE — Therapy (Signed)
OUTPATIENT PHYSICAL THERAPY TREATMENT NOTE  DISCHARGE   Patient Name: Allison Kline MRN: [id number] DOB:11-26-1955, 67 y.o., female Today's Date: 12/14/2022  PCP: Binnie Rail, MD REFERRING PROVIDER: Glennon Mac DO   END OF SESSION:   PT End of Session - 12/14/22 1012     Visit Number 5    Number of Visits 13    Date for PT Re-Evaluation 12/25/22    Authorization Type [employer]    PT Start Time [id number]    PT Stop Time 1055    PT Time Calculation (min) 40 min    Activity Tolerance Patient tolerated treatment well    Behavior During Therapy WFL for tasks assessed/performed                Past Medical History:  Diagnosis Date   Cataract    Hearing loss    Past Surgical History:  Procedure Laterality Date   BREAST BIOPSY Right 2015   STAPEDECTOMY Bilateral    Patient Active Problem List   Diagnosis Date Noted   Preop examination 12/02/2022   Osteoarthritis of right hip 12/02/2022   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   PAIN:  Are you having pain? Yes:  NPRS scale: 3/10 Pain location: Rt anterior hip Pain description: tight  Aggravating factors: sitting, transitioning from sit to stand Relieving factors: movement   OBJECTIVE: (objective measures completed at initial evaluation unless otherwise dated) DIAGNOSTIC FINDINGS:              Hip X-ray 08/29/2023 There is no evidence of hip fracture or dislocation. Severe, asymmetric superior joint space narrowing and femoroacetabular osteophytosis of the right hip. Left hip is relatively preserved, seen in frontal view only.   Hip MRI 09/08/2023 Advanced right hip osteoarthritis with associated degenerative tearing of the right labrum. Much milder appearing left hip osteoarthritis also noted.   PATIENT SURVEYS:  FOTO 60% functional status  12/14/2022: 65%   PALPATION: Mildly tender to palpation right gluteal region and greater trochanter   LOWER EXTREMITY ROM: Patient demonstrates limitations in all ranges of right hip, primarily hip flexion to approximately 90 deg and IR to neutral on right with PROM   LOWER EXTREMITY MMT:   MMT Right eval Left eval 11/27/22 R/L  Right 12/04/22 Right 12/14/22  Hip flexion 4 5     Hip extension 4- 4 4-/4+ 4- 4-  Hip abduction 4- 4  4- 4-  Hip adduction         Hip internal rotation         Hip external rotation         Knee flexion 4+ 5     Knee extension 4+ 5     Ankle dorsiflexion         Ankle plantarflexion         Ankle inversion  Ankle eversion          (Blank rows = not tested)   FUNCTIONAL TESTS:  SLS: trendelenburg on right   GAIT: Assistive device utilized: None Level of assistance: Complete Independence Comments: Trendelenburg on right     TODAY'S TREATMENT:        [facility] Adult PT Treatment:                                                DATE: 12/14/22 Therapeutic Exercise: Recumbent bike L3 x 5 minutes while taking subjective SL hip bridge x 10  Bridge with clamshell using green 2 x 10 Side clamshell with green x 20 each Sidelying arc leg taps 2 x 10 each 90/90 toe taps with physioball x 10   90/90 leg extensions with physioball 2 x 10 Quadruped donkey kick with 4# 2 x 10 each Hip hike on 2" box 2 x 10 each   [facility] Adult PT Treatment:                                                DATE:  12/11/22 Therapeutic Exercise: Recumbent bike level 2 x 5 minutes  SL hip bridge 2 x 10  Sidelying leg taps 2 x 10  90/90 toe taps 2 x 10   Side plank 2 x 20 sec each  Bird dog 2 x 10  Updated HEP  Manual Therapy: LAD RLE x 3 bouts Rt hip mobilizations inferior and posterior grade II-III  [facility] Adult PT Treatment:                                                DATE: 12/04/22 Therapeutic Exercise: Recumbent bike L2 x 5 min while taking subjective Modified thomas stretch x 60 Piriformis push / pull x 30 sec each Bridge with ball squeeze 2 x 10 TA activation with SLR 2 x 10 Sidelying hip abduction x 10 Side clamshell with red x 15 Quadruped hydrant with red x 10 Quadruped donkey kick with red x 10 Deadlift 25# from 6" box 2 x 10 Manual Therapy: LAD right LE x 3 bouts Hooklying lateral hip mobs with belt x 3 bouts  PATIENT EDUCATION:  Education details: HEP Person educated: Patient Education method: Consulting civil engineer, Demonstration, Corporate treasurer cues, Verbal cues  Education comprehension: verbalized understanding, returned demonstration, verbal cues required, tactile cues required, and needs further education   HOME EXERCISE PROGRAM: Access Code: [email or web address]      ASSESSMENT: CLINICAL IMPRESSION: Patient tolerated therapy well with no adverse effects. She has not achieved her LTGs for strength for functional ability on FOTO, but is scheduled for THA so patient will be discharged from PT to independent exercise program in preparation for right THA on 01/05/23.    OBJECTIVE IMPAIRMENTS: Abnormal gait, decreased activity tolerance, decreased ROM, decreased strength, and pain.    ACTIVITY LIMITATIONS: standing, squatting, sleeping, and locomotion level   PARTICIPATION LIMITATIONS: community activity and occupation   PERSONAL FACTORS: Time since onset of injury/illness/exacerbation are also affecting patient's functional outcome.      GOALS: Goals reviewed with patient? Yes  SHORT TERM  GOALS: Target date: 12/04/2022   Patient will be I with initial HEP in order to progress with therapy. Baseline: HEP provided at eval 12/04/2022: progressing Goal status: MET   2.  PT will review FOTO with patient by 3rd visit in order to understand expected progress and outcome with therapy. Baseline: FOTO assessed at eval 12/04/2022: reviewed Goal status: MET   LONG TERM GOALS: Target date: 12/25/2022   Patient will be I with final HEP to maintain progress from PT. Baseline: HEP provided at eval 12/14/2022: independent Goal status: MET   2.  Patient will report >/= 70% status on FOTO to indicate improved functional ability. Baseline: 60% functional 12/14/2022: 65% Goal status: NOT MET   3.  Patient will demonstrate right hip strength >/= 4/5 MMT in order to improve tolerance for standing and walking tasks Baseline: grossly 4-/5 MMT 12/14/2022: 4-/5 MMT Goal status: NOT MET   4.  Patient will report 50% improvement in pain with work related standing and walking tasks in order to reduce functional limitations Baseline: patient reports moderate to severe pain with work related tasks 12/14/2022: patient reports continued limitations and pain Goal status: NOT MET     PLAN: PT FREQUENCY: 1-2x/week   PT DURATION: 6 weeks   PLANNED INTERVENTIONS: Therapeutic exercises, Therapeutic activity, Neuromuscular re-education, Balance training, Gait training, Patient/Family education, Self Care, Joint mobilization, Joint manipulation, Aquatic Therapy, Dry Needling, Spinal manipulation, Spinal mobilization, Ionotophoresis '4mg'$ /ml Dexamethasone, Manual therapy, and Re-evaluation   PLAN FOR NEXT SESSION: NA - discharge    Hilda Blades, PT, DPT, LAT, ATC 12/14/22  11:05 AM Phone: [phone] Fax: [phone]   PHYSICAL THERAPY DISCHARGE SUMMARY  Visits from Start of Care: 5  Current functional level related to goals / functional outcomes: See above   Remaining deficits: See above    Education / Equipment: HEP   Patient agrees to discharge. Patient goals were not met. Patient is being discharged due to the patient's request. (Surgery scheduled)

## 2022-12-29 IMAGING — DX DG KNEE STANDING AP BILAT
1 series · 1 of 1 positions shown · non-contrast
Comparison: None.

CLINICAL DATA: Left knee pain.

EXAM:
LEFT KNEE - COMPLETE 4+ VIEW; BILATERAL KNEES STANDING - 1 VIEW

[knee ap]
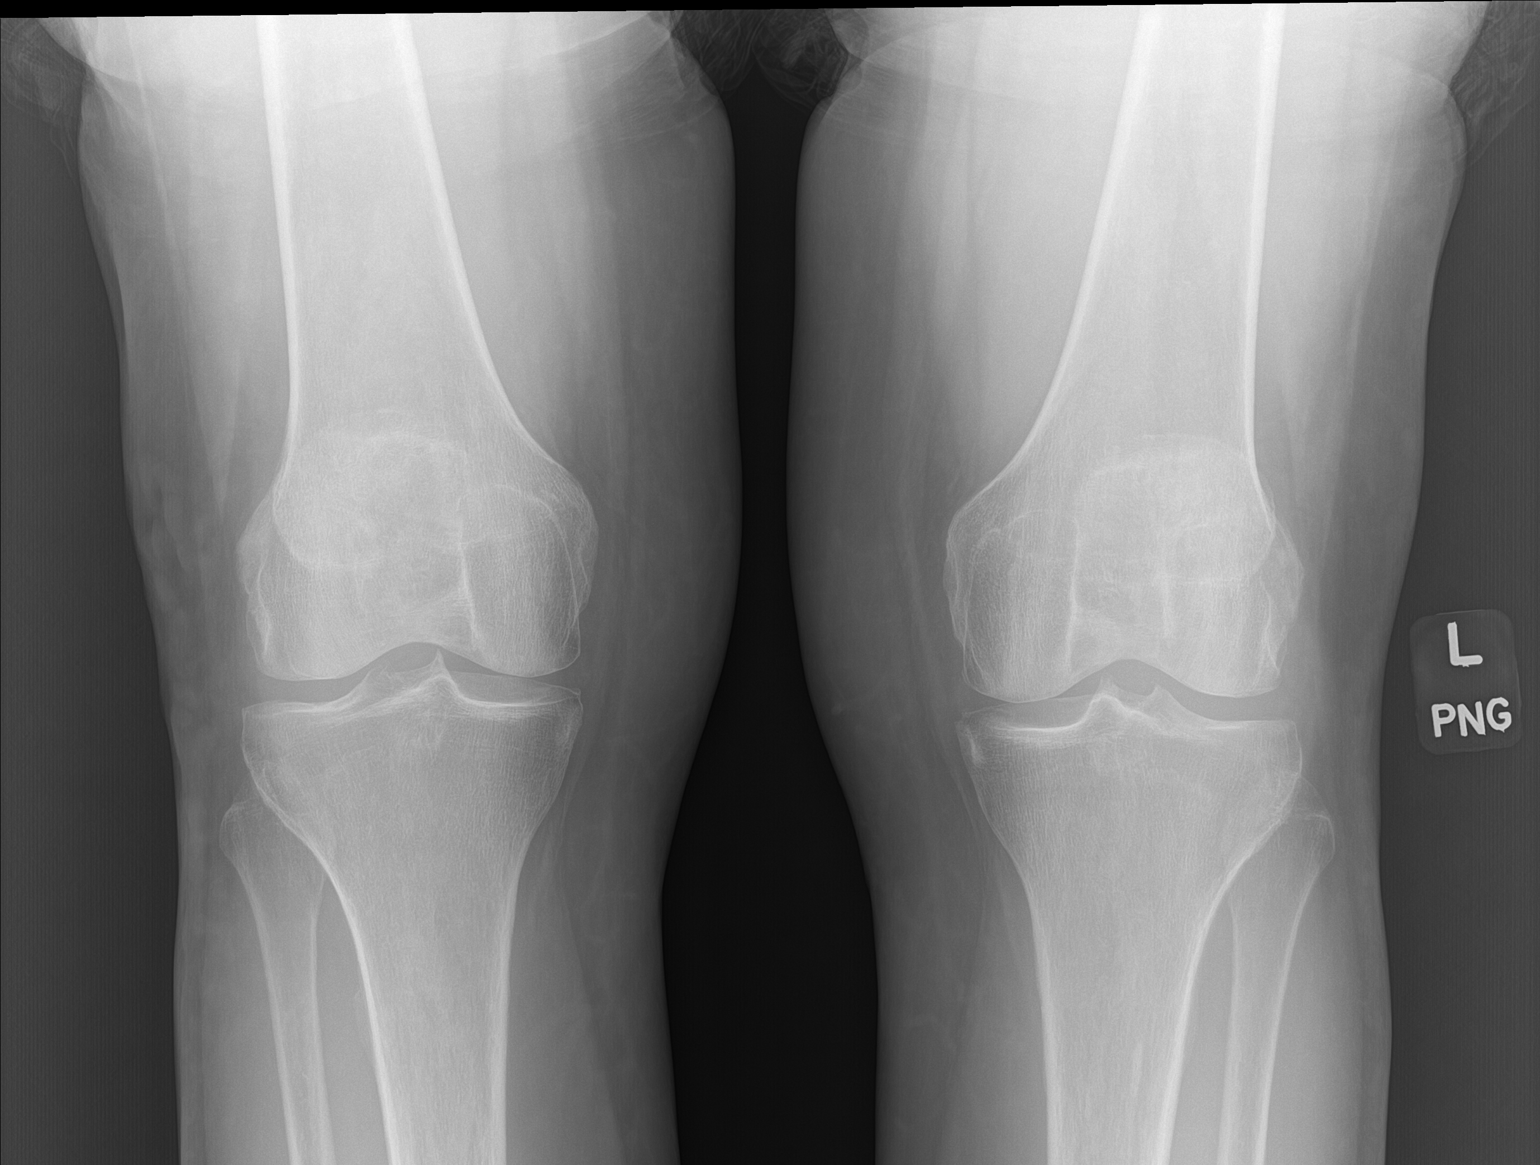

[1 of 1 positions shown; findings below may reference images not displayed]

FINDINGS: There are mild tricompartmental degenerative changes without
evidence for an acute displaced fracture or dislocation. There is no
significant joint effusion. The osseous mineralization is slightly
decreased. There is joint space narrowing involving the medial
compartment of the right knee.
IMPRESSION: Mild tricompartmental degenerative changes are noted of the left
knee without evidence for an acute displaced fracture or
dislocation.

## 2023-02-08 ENCOUNTER — Encounter: Payer: 59 | Admitting: Internal Medicine

## 2023-02-08 DIAGNOSIS — Z0000 Encounter for general adult medical examination without abnormal findings: Secondary | ICD-10-CM

## 2023-06-16 ENCOUNTER — Other Ambulatory Visit: Payer: Self-pay | Admitting: Nurse Practitioner

## 2023-06-16 DIAGNOSIS — Z1231 Encounter for screening mammogram for malignant neoplasm of breast: Secondary | ICD-10-CM

## 2023-07-23 ENCOUNTER — Other Ambulatory Visit: Payer: Self-pay | Admitting: Nurse Practitioner

## 2023-07-23 ENCOUNTER — Other Ambulatory Visit (HOSPITAL_COMMUNITY)
Admission: RE | Admit: 2023-07-23 | Discharge: 2023-07-23 | Disposition: A | Discharge status: Home / Self Care | Payer: 59 | Source: Ambulatory Visit | Attending: Nurse Practitioner | Admitting: Nurse Practitioner

## 2023-07-23 DIAGNOSIS — Z124 Encounter for screening for malignant neoplasm of cervix: Secondary | ICD-10-CM | POA: Insufficient documentation

## 2023-07-28 LAB — CYTOLOGY - PAP
Comment: NEGATIVE
Diagnosis: NEGATIVE
High risk HPV: NEGATIVE

## 2023-07-29 IMAGING — MG MM DIGITAL SCREENING BILAT W/ TOMO AND CAD
8 series · 9 of 24 positions shown · non-contrast
Comparison: Previous exam(s).

CLINICAL DATA: Screening.

EXAM:
DIGITAL SCREENING BILATERAL MAMMOGRAM WITH TOMOSYNTHESIS AND CAD
TECHNIQUE: Bilateral screening digital craniocaudal and mediolateral oblique
mammograms were obtained. Bilateral screening digital breast
tomosynthesis was performed. The images were evaluated with
computer-aided detection.

[L MLO synth-2D]
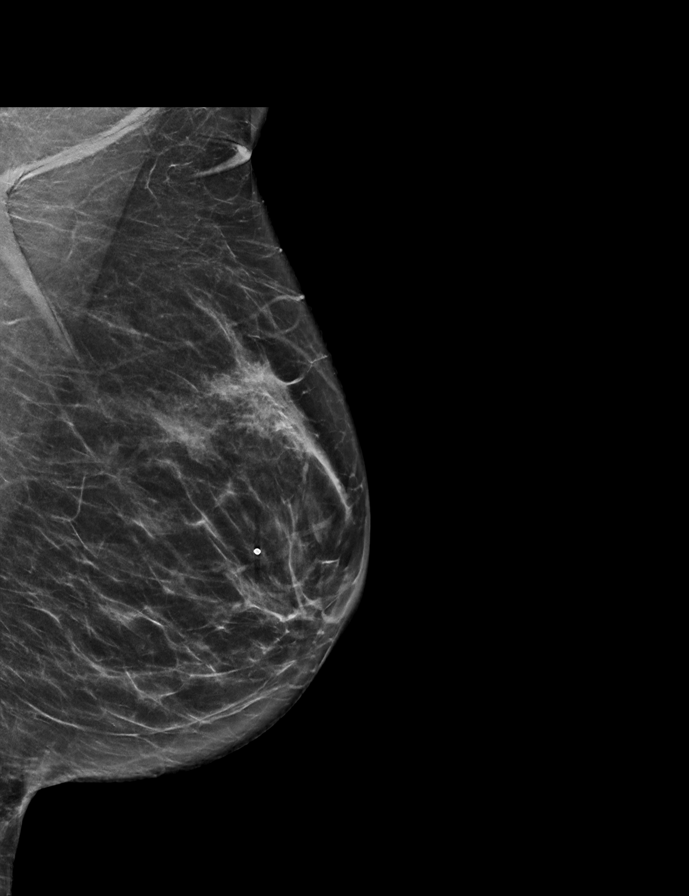

[R MLO synth-2D]
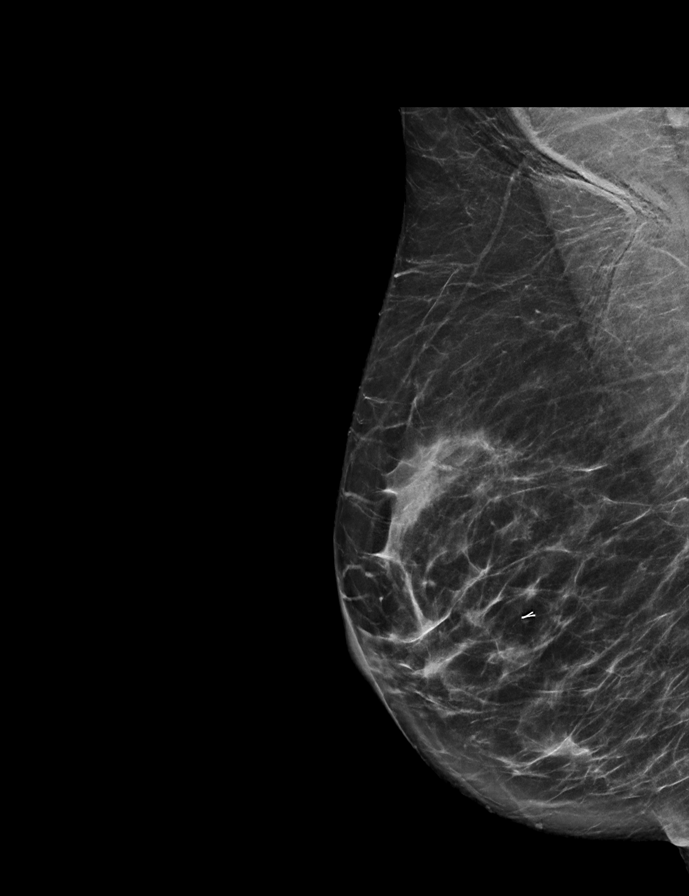

[L CC synth-2D]
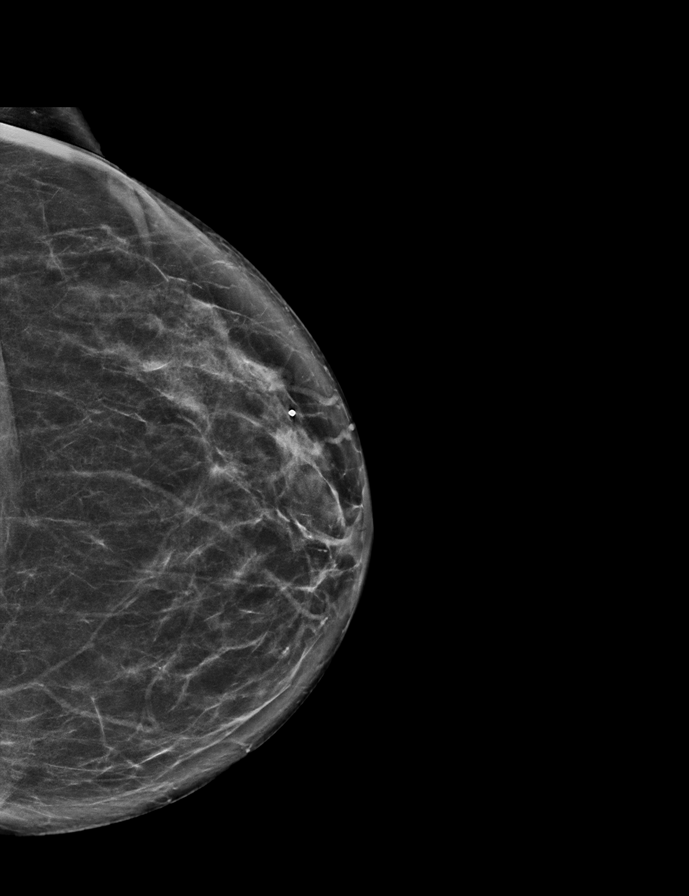

[R CC synth-2D]
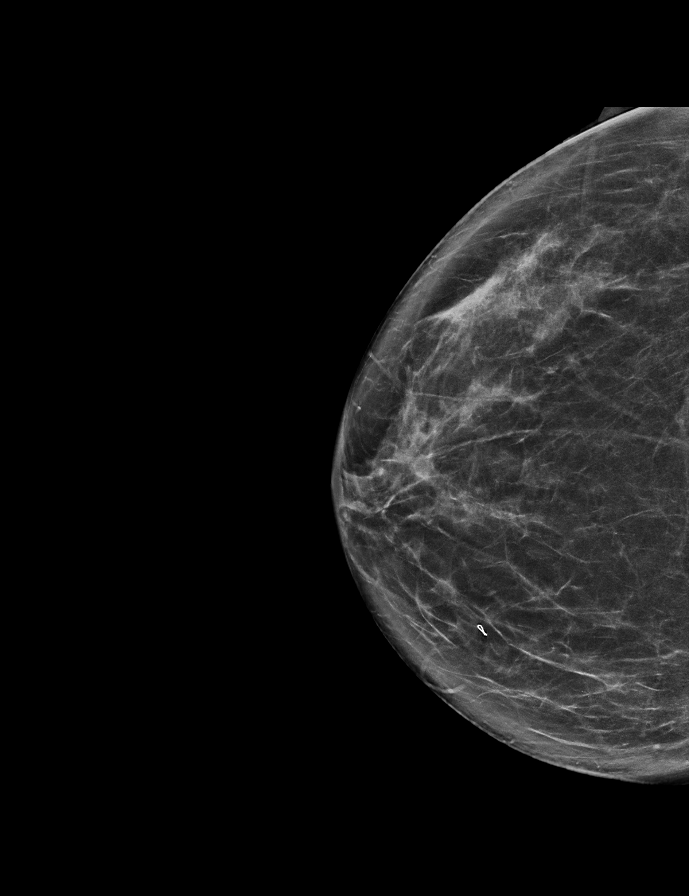

[R CC tomo · 2 of 68 frames shown]
[frame 22/68]
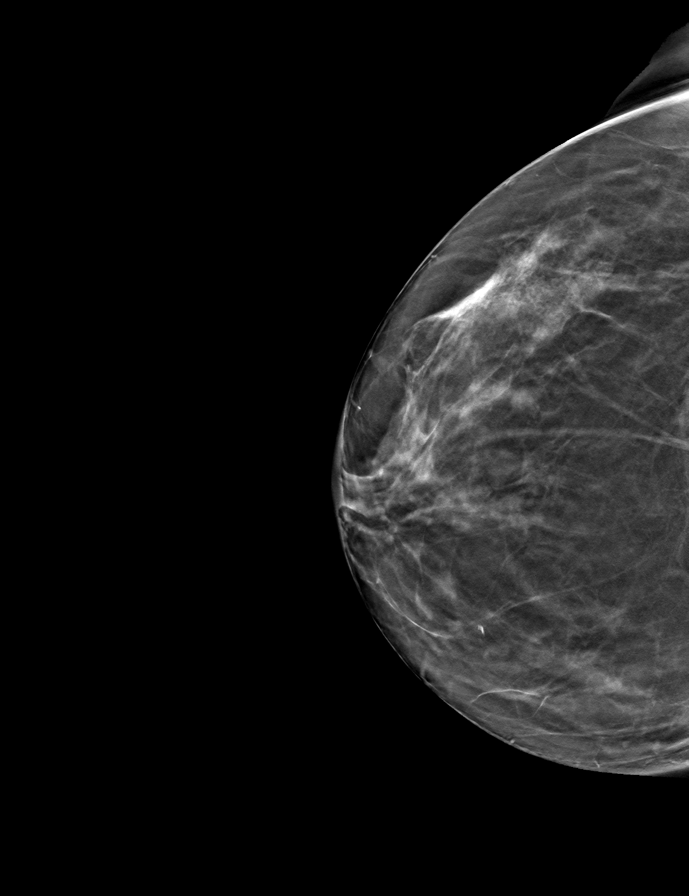
[frame 35/68]
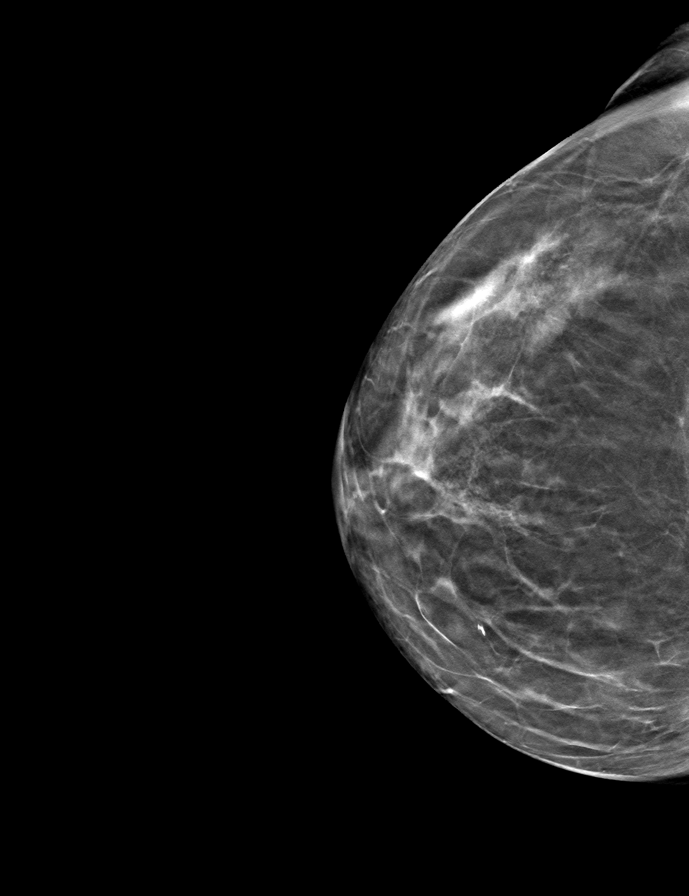

[L CC tomo · tomo slice 33/65.0]
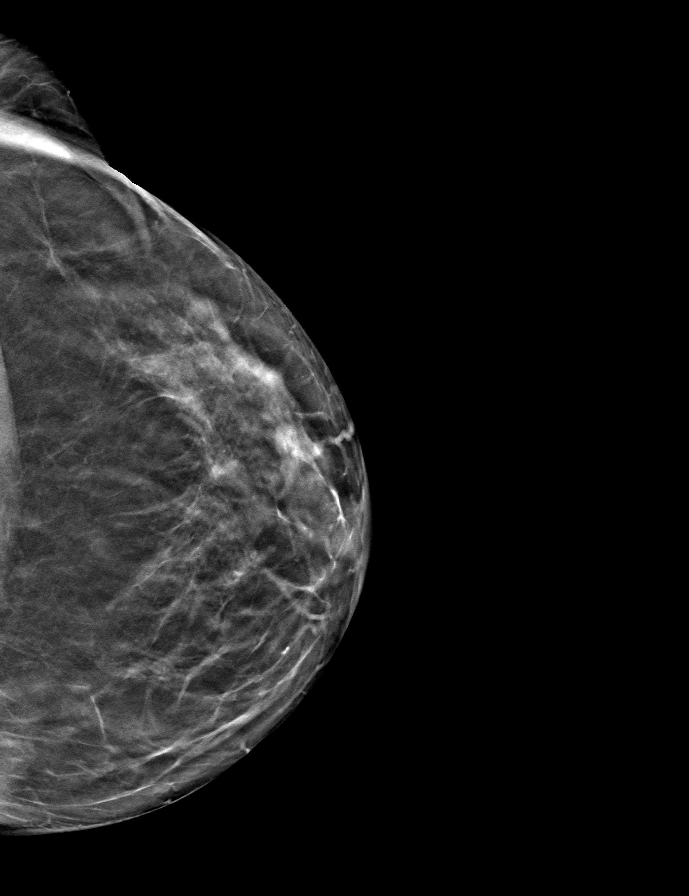

[L MLO tomo · tomo slice 33/66.0]
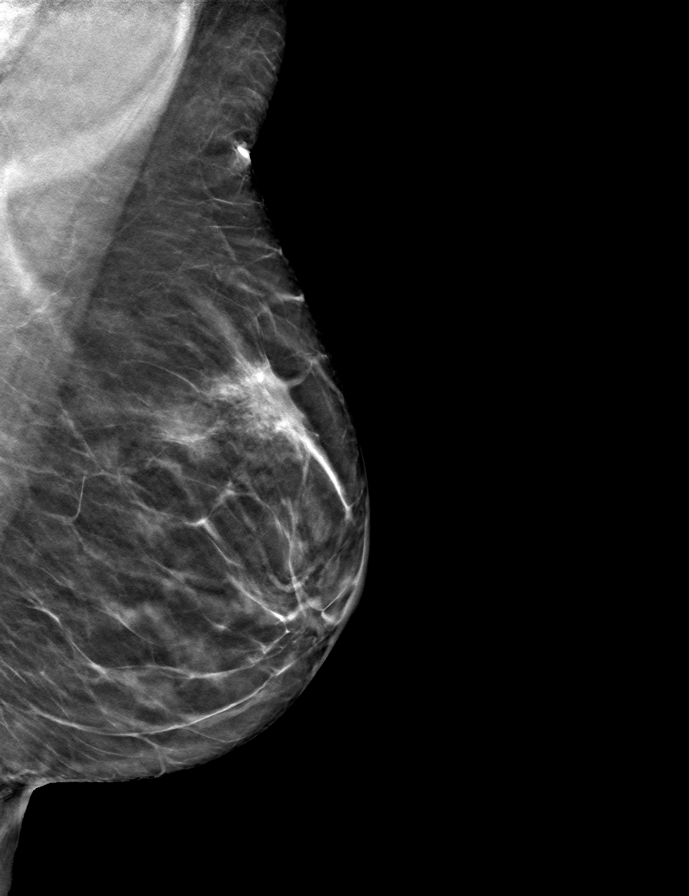

[R MLO tomo · tomo slice 35/69.0]
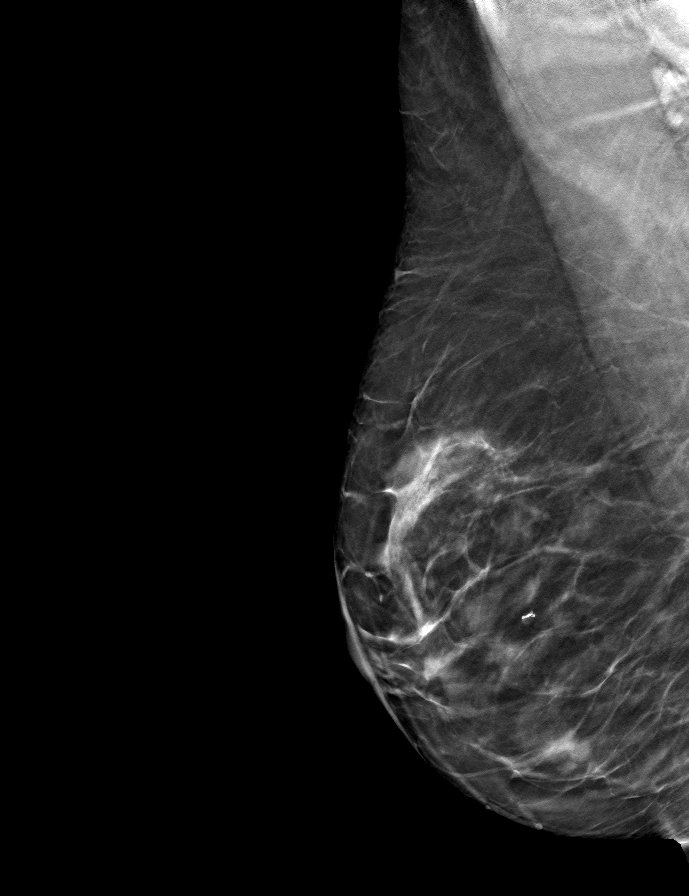

[9 of 24 positions shown; findings below may reference images not displayed]

ACR Breast Density Category c: The breast tissue is heterogeneously
dense, which may obscure small masses.
FINDINGS: There are no findings suspicious for malignancy.
IMPRESSION: No mammographic evidence of malignancy. A result letter of this
screening mammogram will be mailed directly to the patient.

RECOMMENDATION:
Screening mammogram in one year. (Code:Q3-W-BC3)

BI-RADS CATEGORY  1: Negative.

## 2023-08-16 ENCOUNTER — Ambulatory Visit
Admission: RE | Admit: 2023-08-16 | Discharge: 2023-08-16 | Disposition: A | Discharge status: Home / Self Care | Payer: 59 | Source: Ambulatory Visit | Attending: Nurse Practitioner | Admitting: Nurse Practitioner

## 2023-08-16 DIAGNOSIS — Z1231 Encounter for screening mammogram for malignant neoplasm of breast: Secondary | ICD-10-CM

## 2023-08-19 ENCOUNTER — Other Ambulatory Visit: Payer: Self-pay | Admitting: Nurse Practitioner

## 2023-08-19 DIAGNOSIS — R928 Other abnormal and inconclusive findings on diagnostic imaging of breast: Secondary | ICD-10-CM

## 2023-09-13 ENCOUNTER — Ambulatory Visit
Admission: RE | Admit: 2023-09-13 | Discharge: 2023-09-13 | Disposition: A | Discharge status: Home / Self Care | Payer: 59 | Source: Ambulatory Visit | Attending: Nurse Practitioner | Admitting: Nurse Practitioner

## 2023-09-13 ENCOUNTER — Ambulatory Visit: Payer: 59

## 2023-09-13 DIAGNOSIS — R928 Other abnormal and inconclusive findings on diagnostic imaging of breast: Secondary | ICD-10-CM

## 2024-02-07 ENCOUNTER — Encounter: Payer: 59 | Admitting: Internal Medicine

## 2024-02-13 ENCOUNTER — Encounter: Payer: Self-pay | Admitting: Internal Medicine

## 2024-02-13 DIAGNOSIS — E785 Hyperlipidemia, unspecified: Secondary | ICD-10-CM | POA: Insufficient documentation

## 2024-02-13 DIAGNOSIS — Z96641 Presence of right artificial hip joint: Secondary | ICD-10-CM | POA: Insufficient documentation

## 2024-02-13 NOTE — Patient Instructions (Addendum)

## 2024-02-13 NOTE — Progress Notes (Unsigned)
 Subjective:    Patient ID: Allison Kline, female    DOB: 1955/11/11, 68 y.o.   MRN: [id number]      HPI Allison Kline is here for a Physical exam and her chronic medical problems.   Dong well - no changes in health. No concerns.    Medications and allergies reviewed with patient and updated if appropriate.  Current Outpatient Medications on File Prior to Visit  Medication Sig Dispense Refill   Cholecalciferol (VITAMIN D3) 125 MCG (5000 UT) CAPS Take 1 capsule (5,000 Units total) by mouth daily. 30 capsule    FLORICAL 8.3-364 MG CAPS capsule TAKE 1 CAPSULE BY MOUTH 2 TIMES DAILY WITH AS VITAMIN d  daily  3   No current facility-administered medications on file prior to visit.    Review of Systems  Constitutional:  Negative for fever.  Eyes:  Negative for visual disturbance.  Respiratory:  Negative for cough, shortness of breath and wheezing.   Cardiovascular:  Negative for chest pain, palpitations and leg swelling.  Gastrointestinal:  Negative for abdominal pain, blood in stool, constipation and diarrhea.       No gerd  Genitourinary:  Negative for dysuria.  Musculoskeletal:  Negative for arthralgias and back pain (occ lower back pain).  Skin:  Negative for rash.  Neurological:  Negative for light-headedness and headaches.  Psychiatric/Behavioral:  Negative for dysphoric mood and sleep disturbance. The patient is not nervous/anxious (mild).        Objective:   Vitals:   02/14/24 1440  BP: 118/70  Pulse: 82  Temp: 98.5 F (36.9 C)  SpO2: 98%   Filed Weights   02/14/24 1440  Weight: 137 lb (62.1 kg)   Body mass index is 25.06 kg/m.  BP Readings from Last 3 Encounters:  02/14/24 118/70  12/02/22 120/78  10/21/22 124/82    Wt Readings from Last 3 Encounters:  02/14/24 137 lb (62.1 kg)  12/02/22 136 lb (61.7 kg)  10/21/22 137 lb (62.1 kg)       Physical Exam Constitutional: She appears well-developed and well-nourished. No distress.  HENT:  Head:  Normocephalic and atraumatic.  Right Ear: External ear normal. Normal ear canal and TM Left Ear: External ear normal.  Normal ear canal and TM Mouth/Throat: Oropharynx is clear and moist.  Eyes: Conjunctivae normal.  Neck: Neck supple. No tracheal deviation present. No thyromegaly present.  No carotid bruit  Cardiovascular: Normal rate, regular rhythm and normal heart sounds.   No murmur heard.  No edema. Pulmonary/Chest: Effort normal and breath sounds normal. No respiratory distress. She has no wheezes. She has no rales.  Breast: deferred   Abdominal: Soft. She exhibits no distension. There is no tenderness.  Lymphadenopathy: She has no cervical adenopathy.  Skin: Skin is warm and dry. She is not diaphoretic.  Psychiatric: She has a normal mood and affect. Her behavior is normal.     Lab Results  Component Value Date   WBC 5.6 02/02/2022   HGB 12.8 02/02/2022   HCT 38.1 02/02/2022   PLT 281.0 02/02/2022   GLUCOSE 88 02/02/2022   CHOL 235 (H) 02/02/2022   TRIG 51.0 02/02/2022   HDL 94.00 02/02/2022   LDLCALC 131 (H) 02/02/2022   ALT 13 02/02/2022   AST 21 02/02/2022   NA 139 02/02/2022   K 4.1 02/02/2022   CL 102 02/02/2022   CREATININE 0.74 02/02/2022   BUN 20 02/02/2022   CO2 29 02/02/2022   TSH 2.22 02/02/2022  Assessment & Plan:   Physical exam: Screening blood work  ordered Exercise  barre 4 days a week Weight  normal - harder keeping weight down  Substance abuse  none   Reviewed recommended immunizations.   Health Maintenance  Topic Date Due   Colonoscopy  Never done   Pneumonia Vaccine 91+ Years old (1 of 1 - PCV) Never done   DTaP/Tdap/Td (2 - Td or Tdap) 10/20/2023   COVID-19 Vaccine (1 - 2024-25 season) 02/29/2024 (Originally 06/20/2023)   Hepatitis C Screening  01/27/2053 (Originally 08/25/1974)   INFLUENZA VACCINE  05/19/2024   MAMMOGRAM  09/12/2024   DEXA SCAN  11/27/2024   HPV VACCINES  Aged Out   Meningococcal B Vaccine  Aged Out    Zoster Vaccines- Shingrix  Discontinued          See Problem List for Assessment and Plan of chronic medical problems.

## 2024-02-14 ENCOUNTER — Encounter: Payer: Self-pay | Admitting: Internal Medicine

## 2024-02-14 ENCOUNTER — Ambulatory Visit (INDEPENDENT_AMBULATORY_CARE_PROVIDER_SITE_OTHER): Payer: 59 | Admitting: Internal Medicine

## 2024-02-14 VITALS — BP 118/70 | HR 82 | Temp 98.5°F | Ht 62.0 in | Wt 137.0 lb

## 2024-02-14 DIAGNOSIS — M810 Age-related osteoporosis without current pathological fracture: Secondary | ICD-10-CM | POA: Diagnosis not present

## 2024-02-14 DIAGNOSIS — E7800 Pure hypercholesterolemia, unspecified: Secondary | ICD-10-CM

## 2024-02-14 DIAGNOSIS — Z0000 Encounter for general adult medical examination without abnormal findings: Secondary | ICD-10-CM

## 2024-02-14 LAB — COMPREHENSIVE METABOLIC PANEL WITH GFR
ALT: 14 U/L (ref 0–35)
AST: 21 U/L (ref 0–37)
Albumin: 4.2 g/dL (ref 3.5–5.2)
Alkaline Phosphatase: 67 U/L (ref 39–117)
BUN: 20 mg/dL (ref 6–23)
CO2: 30 meq/L (ref 19–32)
Calcium: 9.2 mg/dL (ref 8.4–10.5)
Chloride: 103 meq/L (ref 96–112)
Creatinine, Ser: 0.69 mg/dL (ref 0.40–1.20)
GFR: 90.4 mL/min (ref >60.00)
Glucose, Bld: 101 mg/dL — ABNORMAL HIGH (ref 70–99)
Potassium: 3.9 meq/L (ref 3.5–5.1)
Sodium: 140 meq/L (ref 135–145)
Total Bilirubin: 0.4 mg/dL (ref 0.2–1.2)
Total Protein: 6.8 g/dL (ref 6.0–8.3)

## 2024-02-14 LAB — LIPID PANEL
Cholesterol: 205 mg/dL — ABNORMAL HIGH (ref 0–200)
HDL: 81.5 mg/dL (ref >39.00)
LDL Cholesterol: 107 mg/dL — ABNORMAL HIGH (ref 0–99)
NonHDL: 123.66
Total CHOL/HDL Ratio: 3
Triglycerides: 82 mg/dL (ref 0.0–149.0)
VLDL: 16.4 mg/dL (ref 0.0–40.0)

## 2024-02-14 LAB — CBC WITH DIFFERENTIAL/PLATELET
Basophils Absolute: 0.1 10*3/uL (ref 0.0–0.1)
Basophils Relative: 1 % (ref 0.0–3.0)
Eosinophils Absolute: 0.1 10*3/uL (ref 0.0–0.7)
Eosinophils Relative: 1.2 % (ref 0.0–5.0)
HCT: 37.4 % (ref 36.0–46.0)
Hemoglobin: 12.3 g/dL (ref 12.0–15.0)
Lymphocytes Relative: 24.5 % (ref 12.0–46.0)
Lymphs Abs: 1.6 10*3/uL (ref 0.7–4.0)
MCHC: 32.9 g/dL (ref 30.0–36.0)
MCV: 88.7 fl (ref 78.0–100.0)
Monocytes Absolute: 0.4 10*3/uL (ref 0.1–1.0)
Monocytes Relative: 5.9 % (ref 3.0–12.0)
Neutro Abs: 4.4 10*3/uL (ref 1.4–7.7)
Neutrophils Relative %: 67.4 % (ref 43.0–77.0)
Platelets: 245 10*3/uL (ref 150.0–400.0)
RBC: 4.22 Mil/uL (ref 3.87–5.11)
RDW: 14.1 % (ref 11.5–15.5)
WBC: 6.5 10*3/uL (ref 4.0–10.5)

## 2024-02-14 LAB — TSH: TSH: 1.99 u[IU]/mL (ref 0.35–5.50)

## 2024-02-14 LAB — VITAMIN D 25 HYDROXY (VIT D DEFICIENCY, FRACTURES): VITD: 57.58 ng/mL (ref 30.00–100.00)

## 2024-02-14 NOTE — Assessment & Plan Note (Signed)
 Chronic Check lipid panel,tsh, cmp, cbc Continue with lifestyle  Regular exercise and healthy diet

## 2024-02-14 NOTE — Assessment & Plan Note (Signed)
 Osteoporosis of left hip DEXA up to date Taking vitamin D , magnesium and calcium daily She is exercising regularly Saw endocrine - will see endocrine after next dexa Does have a family history-mother

## 2024-09-01 ENCOUNTER — Other Ambulatory Visit: Payer: Self-pay | Admitting: Internal Medicine

## 2024-09-01 DIAGNOSIS — Z1231 Encounter for screening mammogram for malignant neoplasm of breast: Secondary | ICD-10-CM

## 2024-10-16 ENCOUNTER — Other Ambulatory Visit: Payer: Self-pay | Admitting: Medical Genetics

## 2024-10-23 ENCOUNTER — Ambulatory Visit

## 2024-11-03 ENCOUNTER — Ambulatory Visit
Admission: RE | Admit: 2024-11-03 | Discharge: 2024-11-03 | Disposition: A | Discharge status: Home / Self Care | Source: Ambulatory Visit | Attending: Internal Medicine | Admitting: Internal Medicine

## 2024-11-03 DIAGNOSIS — Z1231 Encounter for screening mammogram for malignant neoplasm of breast: Secondary | ICD-10-CM

## 2024-11-13 ENCOUNTER — Other Ambulatory Visit: Payer: Self-pay

## 2024-11-15 ENCOUNTER — Other Ambulatory Visit: Payer: Self-pay

## 2024-11-15 DIAGNOSIS — Z006 Encounter for examination for normal comparison and control in clinical research program: Secondary | ICD-10-CM

## 2024-11-15 LAB — GENECONNECT MOLECULAR SCREEN

## 2025-02-19 ENCOUNTER — Encounter: Admitting: Internal Medicine
